# Patient Record
Sex: Male | Born: 1985
Health system: Southern US, Community
[De-identification: ages and names within clinical notes are randomized; demographics above are authoritative.]

## PROBLEM LIST (undated history)

## (undated) DIAGNOSIS — G4700 Insomnia, unspecified: Secondary | ICD-10-CM

## (undated) DIAGNOSIS — J45909 Unspecified asthma, uncomplicated: Secondary | ICD-10-CM

## (undated) DIAGNOSIS — Z9889 Other specified postprocedural states: Secondary | ICD-10-CM

## (undated) DIAGNOSIS — D869 Sarcoidosis, unspecified: Secondary | ICD-10-CM

## (undated) DIAGNOSIS — I10 Essential (primary) hypertension: Secondary | ICD-10-CM

## (undated) DIAGNOSIS — G43009 Migraine without aura, not intractable, without status migrainosus: Secondary | ICD-10-CM

## (undated) DIAGNOSIS — M549 Dorsalgia, unspecified: Secondary | ICD-10-CM

## (undated) HISTORY — DX: Insomnia, unspecified: G47.00

## (undated) HISTORY — DX: Essential (primary) hypertension: I10

## (undated) HISTORY — DX: Migraine without aura, not intractable, without status migrainosus: G43.009

## (undated) HISTORY — DX: Sarcoidosis, unspecified: D86.9

## (undated) HISTORY — DX: Dorsalgia, unspecified: M54.9

## (undated) HISTORY — DX: Other specified postprocedural states: Z98.890

---
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
## 2006-03-12 ENCOUNTER — Emergency Department (HOSPITAL_COMMUNITY): Admission: EM | Admit: 2006-03-12 | Discharge: 2006-03-12 | Payer: Self-pay | Admitting: Emergency Medicine

## 2006-03-15 ENCOUNTER — Ambulatory Visit: Payer: Self-pay | Admitting: Family Medicine

## 2006-05-08 ENCOUNTER — Ambulatory Visit: Payer: Self-pay | Admitting: Family Medicine

## 2006-05-08 ENCOUNTER — Encounter: Admission: RE | Admit: 2006-05-08 | Discharge: 2006-05-08 | Payer: Self-pay | Admitting: Sports Medicine

## 2006-06-29 ENCOUNTER — Encounter (INDEPENDENT_AMBULATORY_CARE_PROVIDER_SITE_OTHER): Payer: Self-pay | Admitting: *Deleted

## 2006-07-25 ENCOUNTER — Telehealth: Payer: Self-pay | Admitting: *Deleted

## 2006-07-26 ENCOUNTER — Ambulatory Visit: Payer: Self-pay | Admitting: Family Medicine

## 2007-01-09 ENCOUNTER — Emergency Department (HOSPITAL_COMMUNITY): Admission: EM | Admit: 2007-01-09 | Discharge: 2007-01-10 | Payer: Self-pay | Admitting: Emergency Medicine

## 2007-01-12 ENCOUNTER — Telehealth (INDEPENDENT_AMBULATORY_CARE_PROVIDER_SITE_OTHER): Payer: Self-pay | Admitting: *Deleted

## 2007-03-12 ENCOUNTER — Ambulatory Visit: Payer: Self-pay | Admitting: Family Medicine

## 2007-05-04 ENCOUNTER — Encounter (INDEPENDENT_AMBULATORY_CARE_PROVIDER_SITE_OTHER): Payer: Self-pay | Admitting: Family Medicine

## 2007-05-04 ENCOUNTER — Ambulatory Visit: Payer: Self-pay | Admitting: Family Medicine

## 2007-05-04 DIAGNOSIS — F329 Major depressive disorder, single episode, unspecified: Secondary | ICD-10-CM

## 2007-05-04 LAB — CONVERTED CEMR LAB
Platelets: 245 10*3/uL (ref 150–400)
WBC: 5.8 10*3/uL (ref 4.0–10.5)

## 2007-05-07 ENCOUNTER — Encounter (INDEPENDENT_AMBULATORY_CARE_PROVIDER_SITE_OTHER): Payer: Self-pay | Admitting: Family Medicine

## 2007-05-11 ENCOUNTER — Ambulatory Visit: Payer: Self-pay | Admitting: Family Medicine

## 2007-05-28 ENCOUNTER — Ambulatory Visit: Payer: Self-pay | Admitting: Family Medicine

## 2007-06-03 ENCOUNTER — Encounter: Admission: RE | Admit: 2007-06-03 | Discharge: 2007-06-03 | Payer: Self-pay | Admitting: Family Medicine

## 2007-09-14 ENCOUNTER — Ambulatory Visit: Payer: Self-pay | Admitting: Family Medicine

## 2007-09-14 ENCOUNTER — Encounter: Payer: Self-pay | Admitting: Family Medicine

## 2007-09-14 DIAGNOSIS — G4700 Insomnia, unspecified: Secondary | ICD-10-CM

## 2007-09-14 HISTORY — DX: Insomnia, unspecified: G47.00

## 2007-09-14 LAB — CONVERTED CEMR LAB: GC Probe Amp, Urine: NEGATIVE

## 2007-09-17 ENCOUNTER — Encounter: Payer: Self-pay | Admitting: Family Medicine

## 2007-09-19 ENCOUNTER — Encounter: Payer: Self-pay | Admitting: Family Medicine

## 2007-12-03 ENCOUNTER — Telehealth: Payer: Self-pay | Admitting: *Deleted

## 2007-12-03 ENCOUNTER — Ambulatory Visit: Payer: Self-pay | Admitting: Family Medicine

## 2008-01-15 ENCOUNTER — Telehealth: Payer: Self-pay | Admitting: *Deleted

## 2008-01-16 ENCOUNTER — Ambulatory Visit: Payer: Self-pay | Admitting: Family Medicine

## 2008-01-30 ENCOUNTER — Ambulatory Visit: Payer: Self-pay | Admitting: Family Medicine

## 2008-01-30 ENCOUNTER — Encounter: Payer: Self-pay | Admitting: Family Medicine

## 2008-01-30 DIAGNOSIS — M549 Dorsalgia, unspecified: Secondary | ICD-10-CM | POA: Insufficient documentation

## 2008-01-30 HISTORY — DX: Dorsalgia, unspecified: M54.9

## 2008-03-03 ENCOUNTER — Telehealth: Payer: Self-pay | Admitting: Family Medicine

## 2008-03-04 ENCOUNTER — Ambulatory Visit: Payer: Self-pay | Admitting: Family Medicine

## 2008-04-30 ENCOUNTER — Ambulatory Visit: Payer: Self-pay | Admitting: Family Medicine

## 2008-04-30 ENCOUNTER — Encounter: Admission: RE | Admit: 2008-04-30 | Discharge: 2008-04-30 | Payer: Self-pay | Admitting: Family Medicine

## 2008-04-30 ENCOUNTER — Encounter: Payer: Self-pay | Admitting: Family Medicine

## 2008-05-02 LAB — CONVERTED CEMR LAB: GC Probe Amp, Urine: NEGATIVE

## 2008-05-06 ENCOUNTER — Ambulatory Visit: Payer: Self-pay | Admitting: Family Medicine

## 2008-05-06 ENCOUNTER — Encounter: Payer: Self-pay | Admitting: Family Medicine

## 2008-07-06 ENCOUNTER — Emergency Department (HOSPITAL_BASED_OUTPATIENT_CLINIC_OR_DEPARTMENT_OTHER): Admission: EM | Admit: 2008-07-06 | Discharge: 2008-07-06 | Payer: Self-pay | Admitting: Emergency Medicine

## 2009-01-17 HISTORY — PX: ORIF FINGER FRACTURE: SHX2122

## 2009-04-20 ENCOUNTER — Emergency Department (HOSPITAL_BASED_OUTPATIENT_CLINIC_OR_DEPARTMENT_OTHER): Admission: EM | Admit: 2009-04-20 | Discharge: 2009-04-20 | Payer: Self-pay | Admitting: Emergency Medicine

## 2009-04-21 ENCOUNTER — Ambulatory Visit: Payer: Self-pay | Admitting: Family Medicine

## 2009-04-21 ENCOUNTER — Telehealth: Payer: Self-pay | Admitting: Family Medicine

## 2009-04-21 DIAGNOSIS — G43009 Migraine without aura, not intractable, without status migrainosus: Secondary | ICD-10-CM

## 2009-04-21 HISTORY — DX: Migraine without aura, not intractable, without status migrainosus: G43.009

## 2009-05-11 ENCOUNTER — Ambulatory Visit: Payer: Self-pay | Admitting: Family Medicine

## 2009-05-25 ENCOUNTER — Encounter: Payer: Self-pay | Admitting: Family Medicine

## 2009-05-25 ENCOUNTER — Ambulatory Visit: Payer: Self-pay | Admitting: Family Medicine

## 2009-05-25 DIAGNOSIS — J069 Acute upper respiratory infection, unspecified: Secondary | ICD-10-CM | POA: Insufficient documentation

## 2009-07-06 ENCOUNTER — Ambulatory Visit: Payer: Self-pay | Admitting: Diagnostic Radiology

## 2009-07-06 ENCOUNTER — Emergency Department (HOSPITAL_BASED_OUTPATIENT_CLINIC_OR_DEPARTMENT_OTHER): Admission: EM | Admit: 2009-07-06 | Discharge: 2009-07-06 | Payer: Self-pay | Admitting: Emergency Medicine

## 2010-02-18 NOTE — Progress Notes (Signed)
Summary: triage   Phone Note Call from Patient Call back at [phone]   Caller: mom-Rucinda Summary of Call: Pt had abcess and had it drained and put on an antibotic, but said he needed to be seen today by primary care giver. Initial call taken by: Clydell Hakim,  April 21, 2009 11:08 AM  Follow-up for Phone Call        LM Follow-up by: Golden Circle RN,  April 21, 2009 11:25 AM  Additional Follow-up for Phone Call Additional follow up Details #1::        got unusual noises in backround but no one spoke. no beep to let me know it was an answering machine Additional Follow-up by: Golden Circle RN,  April 21, 2009 12:10 PM    Additional Follow-up for Phone Call Additional follow up Details #2::    walked in. went to [facility] ED last night for elbow pain, migraines, abcess on arm. they lanced it & gave anitbiotic. appt now with pcp to cover most pressing issue. will need a regular appt to handle all of his issues Follow-up by: Golden Circle RN,  April 21, 2009 2:27 PM

## 2010-02-18 NOTE — Assessment & Plan Note (Signed)
Summary: cough,fever,sinus,HA/[place]   Vital Signs:  Patient profile:   25 year old male Weight:      154.2 pounds Pulse rate:   90 / minute BP sitting:   126 / 83  (right arm)  Vitals Entered By: Arlyss Repress CMA, (May 25, 2009 2:44 PM) CC: cough x 3 days. see note. Is Patient Diabetic? No Pain Assessment Patient in pain? no        Primary Care Provider:  Romero Belling MD  CC:  cough x 3 days. see note.Marland Kitchen  History of Present Illness:   2 weeks ago began having runny nose, this progressed to nasal congstion and post nasal drip associated with dry non productive cough. Last pm pt had fever 102F associated with headache and body aches, given Tylenol which relieved symptoms. Concerned mostly about his stuffy nose  ROS- denies N/V, diarrhea, admits to decreased appetite Tried Sudafed without relief, no sick contacts  Habits & Providers  Alcohol-Tobacco-Diet     Tobacco Status: quit  Current Medications (verified): 1)  Amitriptyline Hcl 50 Mg Tabs (Amitriptyline Hcl) .Marland Kitchen.. 1 Tab By Mouth At Bedtime For Headache Prevention 2)  Topiramate 25 Mg Tabs (Topiramate) .... Take 1 Pill Every Night Before Bed. Increase To 2 Pills After 1 Week If Still Having Headaches. 3)  Sumatriptan Succinate 6 Mg/0.40ml Kit (Sumatriptan Succinate) .... Inject Once At Onset of Headache 4)  Tramadol Hcl 50 Mg Tabs (Tramadol Hcl) .... Take 1 Pill Every 8 Hours As Needed For Pain in Your Back. 5)  Afrin Nasal Spray 0.05 % Soln (Oxymetazoline Hcl) .... 2 Sprays Each Nose Twice A Day As Needed  Allergies (verified): No Known Drug Allergies  Social History: Smoking Status:  quit  Physical Exam  General:  Well-developed,well-nourished,in no acute distress; alert,appropriate and cooperative throughout examination Vital signs noted  Eyes:  Non icteric , pink conjunctiva Ears:  External ear exam shows no significant lesions or deformities.  Otoscopic examination reveals clear canals, tympanic membranes  are intact bilaterally without bulging, retraction, inflammation or discharge. Hearing is grossly normal bilaterally. Nose:  No rhinorrhea, boggy turbinates, Mouth:  MMM, no exudatae Neck:  Shotty LAD left cervical chain Lungs:  CTAB Heart:  RRR   Impression & Recommendations:  Problem # 1:  VIRAL URI (ICD-465.9) Assessment New  Supportive care, as nasal congestion most worrisome, given Afrin with intructions on use. No evidence of sinusitis and history not compatible, no antibiotics at this time.  Orders: [facility]- Est Level  3 ([address])  Complete Medication List: 1)  Amitriptyline Hcl 50 Mg Tabs (Amitriptyline hcl) .Marland Kitchen.. 1 tab by mouth at bedtime for headache prevention 2)  Topiramate 25 Mg Tabs (Topiramate) .... Take 1 pill every night before bed. increase to 2 pills after 1 week if still having headaches. 3)  Sumatriptan Succinate 6 Mg/0.21ml Kit (Sumatriptan succinate) .... Inject once at onset of headache 4)  Tramadol Hcl 50 Mg Tabs (Tramadol hcl) .... Take 1 pill every 8 hours as needed for pain in your back. 5)  Afrin Nasal Spray 0.05 % Soln (Oxymetazoline hcl) .... 2 sprays each nose twice a day as needed  Patient Instructions: 1)  You have a virus, you do not need antibiotics for this  2)   Take Tylenol as needed for fever  and muscle aches, you can also try Ibuprofen 3)  Use the nasal spray as directed no longer than 3 days 4)  If your fever persist past Wed, or you have new symptoms, you  need to come back to be re-evaluated  Prescriptions: AFRIN NASAL SPRAY 0.05 % SOLN (OXYMETAZOLINE HCL) 2 sprays each nose twice a day as needed  #1 x 1   Entered and Authorized by:   Milinda Antis MD   Signed by:   Milinda Antis MD on 05/25/2009   Method used:   Electronically to        [facility] # [phone]* (retail)       [address]       [place], [place]  [address]       Ph: [phone]       Fax: [phone]   RxID:   [id number]
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
## 2010-02-18 NOTE — Miscellaneous (Signed)
Summary: waLK IN   Clinical Lists Changes states he has had symptoms for weeks. has tried otc w/o relief. had a high fever yesterday that has now broken.  placed in schedule to be seen now.Golden Circle RN  May 25, 2009 2:41 PM

## 2010-02-18 NOTE — Assessment & Plan Note (Signed)
Summary: abscess, headache, back pain   Vital Signs:  Patient profile:   25 year old male Height:      67.75 inches Weight:      164 pounds BMI:     25.21 BSA:     1.88 Temp:     98.3 degrees F Pulse rate:   94 / minute BP sitting:   144 / 75  Vitals Entered By: Jone Baseman CMA (April 21, 2009 2:43 PM) CC: abscess, headeache, back pain Is Patient Diabetic? No Pain Assessment Patient in pain? yes     Location: left arm Intensity: 7   Primary Care Provider:  Romero Belling MD  CC:  abscess, headeache, and back pain.  History of Present Illness: 25 yo male seen as workin with 3 complaints:  ABSCESS, RIGHT ARM.  Drained last night at ED--I&D, started on Bactrim.  Had been there for 2 days.  No fevers or pain.  No drainage today.  Taking Bactrim as prescribed.  HEADACHES.  All day, every day, moderate to severe LEFT side from orbit to occiput.  Taking Vicodin (prescribed for back pain, ran out) and Exedrin without relief. Duration = 2 months.  No focal weakness or numbness.  Some nausea, no vomiting, mild photophobia.  BACKACHE.  Chronic.  Deferred today.  Habits & Providers  Alcohol-Tobacco-Diet     Tobacco Status: current     Tobacco Counseling: to quit use of tobacco products     Cigarette Packs/Day: 0.5  Allergies (verified): No Known Drug Allergies  Social History: Smoking Status:  current Packs/Day:  0.5  Review of Systems       Per HPI.  Physical Exam  Additional Exam:  VITALS:  Reviewed, hypertensive GEN: Alert & oriented, no acute distress NEURO:  CN II-XII intact, 5/5 strength bilateral UE HEAD:  Normocephalic, atraumatic. EYES:  No corneal or conjunctival inflammation noted. EOMI. PERRLA.  Vision grossly normal. EARS:  External ear without significant lesions or deformities.  Clear canals, TM intact bilaterally without bulging, retraction, inflammation or discharge. Hearing grossly normal bilaterally. SKIN:  RIGHT ARM--no fluctuance, no  spreading erythema, nontender   Impression & Recommendations:  Problem # 1:  COMMON MIGRAINE (ICD-346.10) Assessment New  New onset x2 months.  Daily.  See patient instructions. Have started Amitriptyline 25 mg by mouth at bedtime x4 days, then 50 mg by mouth at bedtime.  Orders: [facility]- Est  Level 4 ([address])  Problem # 2:  CELLULITIS AND ABSCESS OF UPPER ARM AND FOREARM (ICD-682.3) Assessment: Improved  RIGHT ARM--resolved after I&D last night.  Finish antibiotics.  His updated medication list for this problem includes:    Bactrim Ds 800-160 Mg Tabs (Sulfamethoxazole-trimethoprim) .Marland Kitchen... 1 tab by mouth two times a day x3 days  Orders: [facility]- Est  Level 4 ([address])  Complete Medication List: 1)  Bactrim Ds 800-160 Mg Tabs (Sulfamethoxazole-trimethoprim) .Marland Kitchen.. 1 tab by mouth two times a day x3 days 2)  Amitriptyline Hcl 50 Mg Tabs (Amitriptyline hcl) .Marland Kitchen.. 1 tab by mouth at bedtime for headache prevention  Patient Instructions: 1)  Good to see you today. 2)  For Migraine:  Take Amitriptyline 1/2 tablet at bedtime for 4 nights, then 1 full tablet at bedtime.  Stop taking all other pain medicines, stop smoking, no caffeine.  Keep a headache calendar--mark the days you have a headache. 3)  For abscess--should resolve fine, just finish the antibiotics. 4)  One of the most important things you can do for your health is to STOP  SMOKING.  When you are ready to quit smoking, please call the "quit line" at 1-800-QUIT-NOW ((838)455-5257).  You can get more information about the quit line at [email or web address].  It is free! 5)  Please schedule a follow-up appointment in 2 weeks for headache.  Prescriptions: AMITRIPTYLINE HCL 50 MG TABS (AMITRIPTYLINE HCL) 1 tab by mouth at bedtime for headache prevention  #30 x 3   Entered and Authorized by:   Romero Belling MD   Signed by:   Romero Belling MD on 04/21/2009   Method used:   Print then Give to Patient   RxID:   [id number]   Appended Document:  abscess, headache, back pain Correction:  headaches are on the RIGHT, cellulitis is on the LEFT arm.

## 2010-03-04 ENCOUNTER — Encounter: Payer: Self-pay | Admitting: *Deleted

## 2010-04-07 LAB — WOUND CULTURE

## 2010-04-20 ENCOUNTER — Other Ambulatory Visit: Payer: Self-pay | Admitting: Family Medicine

## 2010-04-20 MED ORDER — TRAMADOL HCL 50 MG PO TABS: 50.0000 mg | ORAL_TABLET | Freq: Three times a day (TID) | ORAL | Status: DC | PRN

## 2010-04-30 ENCOUNTER — Other Ambulatory Visit: Payer: Self-pay | Admitting: Family Medicine

## 2010-04-30 MED ORDER — TRAMADOL HCL 50 MG PO TABS: 50.0000 mg | ORAL_TABLET | Freq: Three times a day (TID) | ORAL | Status: DC | PRN

## 2010-05-18 ENCOUNTER — Ambulatory Visit (INDEPENDENT_AMBULATORY_CARE_PROVIDER_SITE_OTHER): Payer: BC Managed Care – PPO | Admitting: Family Medicine

## 2010-05-18 VITALS — BP 125/90 | HR 104 | Temp 98.0°F | Wt 155.0 lb

## 2010-05-18 DIAGNOSIS — R05 Cough: Secondary | ICD-10-CM | POA: Insufficient documentation

## 2010-05-18 MED ORDER — AZITHROMYCIN 250 MG PO TABS: ORAL_TABLET | ORAL | Status: AC

## 2010-05-18 MED ORDER — HYDROCOD POLST-CHLORPHEN POLST 10-8 MG/5ML PO LQCR: 5.0000 mL | Freq: Every evening | ORAL | Status: DC | PRN

## 2010-05-18 NOTE — Progress Notes (Signed)
  Subjective:    Patient ID: Jeremiah Mason, male    DOB: 1985-10-17, 25 y.o.   MRN: [id number]  Cough This is a new problem. The current episode started 1 to 4 weeks ago (about 8 days ago). The problem has been gradually worsening. The problem occurs constantly (worse at night). The cough is productive of sputum. Associated symptoms include chest pain, chills, a fever, myalgias and wheezing. Pertinent negatives include no ear congestion, ear pain, headaches, heartburn, hemoptysis, nasal congestion, postnasal drip, rash, rhinorrhea, sore throat, shortness of breath, sweats or weight loss. The symptoms are aggravated by lying down. He has tried a beta-agonist inhaler, OTC cough suppressant and rest for the symptoms. The treatment provided no relief. His past medical history is significant for asthma and pneumonia. Hx of atypical pneumonia about 1 year ago.  This feels the same as that      Review of Systems  Constitutional: Positive for fever and chills. Negative for weight loss.  HENT: Negative for ear pain, sore throat, rhinorrhea and postnasal drip.   Respiratory: Positive for cough and wheezing. Negative for hemoptysis and shortness of breath.   Cardiovascular: Positive for chest pain.  Gastrointestinal: Negative for heartburn.  Musculoskeletal: Positive for myalgias.  Skin: Negative for rash.  Neurological: Negative for headaches.       Objective:   Physical Exam  Vitals reviewed. Constitutional: He appears well-nourished. No distress.  HENT:  Nose: Nose normal.  Mouth/Throat: Oropharynx is clear and moist. No oropharyngeal exudate.  Eyes: Conjunctivae are normal. Right eye exhibits no discharge. Left eye exhibits no discharge.  Neck: Normal range of motion. Neck supple.  Cardiovascular: Normal rate, regular rhythm and normal heart sounds.   No murmur heard. Pulmonary/Chest: Effort normal and breath sounds normal. No stridor. No respiratory distress. He has no wheezes. He has no  rales. He exhibits no tenderness.       No consolidation.  Faint rhales in right lower lung base   Abdominal: Soft.  Musculoskeletal: He exhibits no edema.  Lymphadenopathy:    He has no cervical adenopathy.  Skin: Skin is warm and dry. No rash noted. He is not diaphoretic.          Assessment & Plan:

## 2010-05-18 NOTE — Assessment & Plan Note (Signed)
Concerning for atypical pneumonia.  His cough is getting worse.  He has had atypical pneumonia about 1 year ago and this feels the same as that.  He required an abx and rx cough suppressant to get over it.  Advised him that this could be a pneumonia so will treat with Azithromycin and Tussionex.  Precautions given.  RTC in 5 days if not better.

## 2010-05-18 NOTE — Patient Instructions (Signed)
Pneumonia Pneumonia is an infection of the lungs. It may be caused by a bacteria or virus. Most forms are bacterial. Usually, these infections are caused by breathing infectious particles into the lungs (respiratory tract). SYMPTOMS   The most common problems (symptoms) are:    Cough.   Fever.    Chest pain.   Increased rate of breathing.     Wheezing.   Mucus production.     DIAGNOSIS   Often these infections are diagnosed on exam by your caregiver. Sometimes the diagnosis may require:    Chest X-rays.   Blood analysis.   Cultures. Blood cultures may be done to help find the cause of your pneumonia.  Your caregiver may do tests (blood gasses or pulse oximetry) to see how well your lungs are working. TREATMENT The bacterial pneumonias generally respond well to medicines (antibiotics) that kill germs. Viral infections must run their course. These infections will not respond to antibiotics. A pneumococcal shot (vaccine) is available to prevent a common bacterial pneumonia. This is usually suggested for the elderly and for other groups of higher risk individuals, such as those on chemotherapy or those who have problems with their immune system.  You will have pneumococcal screening or vaccination if you are over 48 years old and are not immunized.   If you are a smoker, it is time to quit. You may receive instructions on how to best stop smoking. Your caregiver can provide medicines and counseling to help you quit.  HOME CARE INSTRUCTIONS  Cough suppressants may be used if you are losing too much rest. However, coughing protects you by clearing your lungs. This is one reason for not using cough suppressants, if able, as they take away this protection.   Your caregiver may have prescribed an antibiotic if she or he feels your cough is caused by a bacterial infection. Take all your medicine until you are finished.   Your caregiver may also prescribe an expectorant to loosen the mucus to  be coughed up.   Only take over-the-counter or prescription medicines for pain, discomfort, or fever as directed by your caregiver.   Smoking is a common cause of bronchitis and can contribute to pneumonia. Stopping this habit is an important self-help step.   If you are a smoker and continue to smoke, your cough may last several weeks after your pneumonia has cleared.   A cold steam vaporizer or humidifier in your room or home may help loosen mucus.   Coughing is often worse at night. Sleeping in a semi-upright position in a recliner or using a couple pillows under your head will help with this.   Get rest as you feel it is needed. Your body will usually let you know when to rest.  SEEK IMMEDIATE MEDICAL CARE IF:  You develop pus-like mucus (sputum) or your illness becomes worse. This is especially true if you are elderly or weakened from any other disease.   You cannot control your cough with suppressants and are losing sleep.   You begin coughing up blood.   You develop pain which is getting worse or is uncontrolled with medicines.   You or your child has an oral temperature above 101, not controlled by medicine.   Any of the symptoms which initially brought you in for treatment are getting worse rather than better.   You develop shortness of breath or chest pain.  MAKE SURE YOU:    Understand these instructions.   Will watch your condition.  Will get help right away if you are not doing well or get worse.  Document Released: 01/03/2005 Document Re-Released: 06/23/2009 [facility] Patient Information 2011 [place], [place].

## 2010-06-02 ENCOUNTER — Other Ambulatory Visit: Payer: Self-pay | Admitting: Family Medicine

## 2010-06-02 MED ORDER — SUMATRIPTAN SUCCINATE 6 MG/0.5ML ~~LOC~~ SOLN: 6.0000 mg | Freq: Once | SUBCUTANEOUS | Status: DC

## 2010-06-04 NOTE — Op Note (Signed)
NAME:  Jeremiah Mason, Jeremiah Mason                        ACCOUNT NO.:  [id number]   MEDICAL RECORD NO.:  [id number]                   PATIENT TYPE:  AMB   LOCATION:  [facility]                                  FACILITY:  [facility]   PHYSICIAN:  Lowell Bouton, M.D.      DATE OF BIRTH:  [birth year]   DATE OF PROCEDURE:  06/20/2003  DATE OF DISCHARGE:                                 OPERATIVE REPORT   PREOPERATIVE DIAGNOSIS:  Oblique fracture, middle phalanx, right small  finger, that was intra-articular at the distal interphalangeal joint.   POSTOPERATIVE DIAGNOSIS:  Oblique fracture, middle phalanx, right small  finger, that was intra-articular at the distal interphalangeal joint.   PROCEDURE:  Closed reduction and percutaneous pinning of middle phalanx  fracture, right small finger.   SURGEON:  Lowell Bouton, M.D.   ANESTHESIA:  General.   OPERATIVE FINDINGS:  The patient had an oblique intra-articular fracture of  the middle phalanx that was not displaced.   PROCEDURE:  Under general anesthesia, the right hand was prepped and draped  in the usual fashion and under x-ray control, a 0.035 K-wire was passed  transversely across the middle phalanx.  A second 0.035 K-wire was placed  just proximal to the first from radial to ulnar across the fracture site.  X-  rays showed good position of the pins and good position of the fracture.  Clinically there was no gross rotational deformity.  The pins were bent over  and left protruding from the skin and sterile dressings were applied.  Marcaine 0.5% digital block was inserted for pain control.  The patient was  splinted and went to the recovery room awake and stable, in good condition.                                               Lowell Bouton, M.D.    EMM/MEDQ  D:  06/20/2003  T:  06/21/2003  Job:  [id number]

## 2010-08-24 ENCOUNTER — Ambulatory Visit (INDEPENDENT_AMBULATORY_CARE_PROVIDER_SITE_OTHER): Payer: BC Managed Care – PPO | Admitting: Family Medicine

## 2010-08-24 ENCOUNTER — Encounter: Payer: Self-pay | Admitting: Family Medicine

## 2010-08-24 DIAGNOSIS — F410 Panic disorder [episodic paroxysmal anxiety] without agoraphobia: Secondary | ICD-10-CM

## 2010-08-24 DIAGNOSIS — M549 Dorsalgia, unspecified: Secondary | ICD-10-CM

## 2010-08-24 DIAGNOSIS — Z111 Encounter for screening for respiratory tuberculosis: Secondary | ICD-10-CM

## 2010-08-24 DIAGNOSIS — M25569 Pain in unspecified knee: Secondary | ICD-10-CM | POA: Insufficient documentation

## 2010-08-24 DIAGNOSIS — F329 Major depressive disorder, single episode, unspecified: Secondary | ICD-10-CM

## 2010-08-24 DIAGNOSIS — Z20828 Contact with and (suspected) exposure to other viral communicable diseases: Secondary | ICD-10-CM

## 2010-08-24 MED ORDER — VENLAFAXINE HCL ER 37.5 MG PO CP24: ORAL_CAPSULE | ORAL | Status: DC

## 2010-08-24 MED ORDER — TRAMADOL HCL 50 MG PO TABS: 50.0000 mg | ORAL_TABLET | Freq: Three times a day (TID) | ORAL | Status: DC | PRN

## 2010-08-24 NOTE — Patient Instructions (Addendum)
For your knee, I want you to go see Dr. Prince Rome again.  Call his office and make an appt  For the panic attacks, I want you to try venlafaxine.  You will start with one tab and then increase to 2 tabs.  This may also help your pain.    I will refill your tramadol.    Rarely, tramadol and an antidepressant can cause a bad reaction.  I have included what to look for.    I will call you if there is anything positive on the STD screenSerotonin Syndrome Taking some kinds of drugs increases the serotonin in your body. Serotonin is a brain chemical that regulates the nervous system. Drugs that increase the serotonin in your body include:    Anti-depressant medications.   St. John's wort.   Recreational drugs.     Migraine medicines.   Some pain medicines.     Combining these drugs increases the risk you will become ill with a toxic condition called serotonin syndrome.   Symptoms of too much serotonin include:  Confusion.   Agitation.    Weakness.   Insomnia.    Fever.   Sweats.    Other symptoms that may develop include:  Shakiness.   Muscle spasms.   Seizures.  Hospital treatment is often needed until the effects are controlled. Avoiding the combination of medicines listed above is recommended. Check with your doctor if you are concerned about your medicine or the side effects. Document Released: 02/11/2004 Document Re-Released: 10/13/2007 [facility] Patient Information 2011 [place], [place].

## 2010-08-24 NOTE — Assessment & Plan Note (Signed)
No red flags.  Will start effexor today.  RTC in one month for recheck

## 2010-08-24 NOTE — Assessment & Plan Note (Signed)
Left knee, no acute changes, no swelling.  Will have Dr. Prince Rome evaluate this at next appt.  Continue brace and ice PRN

## 2010-08-24 NOTE — Assessment & Plan Note (Signed)
May play into pain, but not clinically depressed today.  Will start effexor for panic and chronic pain
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
## 2010-08-24 NOTE — Assessment & Plan Note (Signed)
Asked pt to make appt with Dr. Prince Rome for reevaluation, continue injections if warranted.

## 2010-08-25 LAB — GC/CHLAMYDIA PROBE AMP, URINE: GC Probe Amp, Urine: NEGATIVE

## 2010-11-18 ENCOUNTER — Telehealth: Payer: Self-pay | Admitting: Family Medicine

## 2010-11-18 NOTE — Telephone Encounter (Signed)
Phone call to the emergency line tonight:  Bright red blood in stool off and on x 1 year,  Called emergency line tonight b/c had increase amount of BRBPR on tissue paper and in toilet.   No dizziness, no syncope,  No history of GI Bleed. No abd pain. Told pt to call office at 8:30am to get an appointment for further evaluation.

## 2010-11-19 ENCOUNTER — Ambulatory Visit: Payer: BC Managed Care – PPO | Admitting: Family Medicine

## 2010-11-25 ENCOUNTER — Ambulatory Visit (INDEPENDENT_AMBULATORY_CARE_PROVIDER_SITE_OTHER): Payer: BC Managed Care – PPO | Admitting: Family Medicine

## 2010-11-25 DIAGNOSIS — S61209A Unspecified open wound of unspecified finger without damage to nail, initial encounter: Secondary | ICD-10-CM

## 2010-11-25 DIAGNOSIS — S61219A Laceration without foreign body of unspecified finger without damage to nail, initial encounter: Secondary | ICD-10-CM | POA: Insufficient documentation

## 2010-11-25 DIAGNOSIS — K5900 Constipation, unspecified: Secondary | ICD-10-CM | POA: Insufficient documentation

## 2010-11-25 DIAGNOSIS — R300 Dysuria: Secondary | ICD-10-CM | POA: Insufficient documentation

## 2010-11-25 LAB — POCT URINALYSIS DIPSTICK
Glucose, UA: NEGATIVE
Ketones, UA: NEGATIVE
Nitrite, UA: NEGATIVE
Protein, UA: NEGATIVE
pH, UA: 7.5

## 2010-11-25 MED ORDER — DOCUSATE SODIUM 100 MG PO CAPS: 100.0000 mg | ORAL_CAPSULE | Freq: Two times a day (BID) | ORAL | Status: AC

## 2010-11-25 NOTE — Assessment & Plan Note (Signed)
Likely cause of bright red blood per rectum. No red flags. Physical exam benign. Patient is on tramadol without bowel regimen. Will start him on Colace twice a day to help decrease the amount he needs to strain when having a bowel movement.

## 2010-11-25 NOTE — Patient Instructions (Addendum)
It was nice to meet you today. You can talk with her regular doctor about changing the dose of your tramadol. I think that the blood in your stool is likely from your constipation. I am starting you on a medicine called Colace. This will help to soften your stools and make it less difficult for you to have a bowel movement. Your urine does not look like it is infected.  Please come back if he starts having fevers, chills, pain when urinating, or new pain in your back. Please keep your appointment with your regular doctor later this month. Please return if you have an increased amount of bright red blood per rectum, change in your stools, start having fevers or chills, or start having significant abdominal pain.

## 2010-11-25 NOTE — Progress Notes (Signed)
S:  1. Pt comes in today for rectal bleeding. Patient states that 2-3 days ago he had a significant amount of bright red blood both in the toilet bowl and on the toilet tissue. He denies any melena. He does endorse constipation. He states that he has a bowel movement every 2-3 days. He often needs to strain. He thinks that this is related to the fact that he is on tramadol. He is not on any bowel regimen. He's never had rectal bleeding like this before. He denies any nausea, vomiting, diarrhea. He has not state that he has had any change in his bowel habits. He states that his stools look similar to previously. They are not small-caliber. He has not had any abdominal pain.  2. Patient also reports that he "feels like something is stuck" in his penis when he is urinating. He also endorses increased frequency. He states that it does not burn. He is currently sexually active, however he denies any penile discharge. He states he is one partner and is not concerned for STDs at this time. He denies any fever chills, or flank pain.  3. patient also concerned with cut on left index finger that he sustained with scissors yesterday. His were cleaned out of a car. There was minimal to moderate bleeding. He has minimal pain. He has full range of motion and no swelling.  ROS: Per HPI  History  Smoking status  . Current Everyday Smoker -- 0.3 packs/day  . Types: Cigars  Smokeless tobacco  . Never Used    O:  Filed Vitals:   11/25/10 0843  BP: 126/98  Pulse:   Temp:     Gen: NAD, appropriate CV: RRR, no murmur Pulm: CTA bilat, no wheezes or crackles Abd: soft, NT, + BS x 4 Ext: Warm, no chronic skin changes, no edema; left index finger with small 1.5 cm superficial laceration exposing adipose tissue, no surrounding erythema or edema, no need for stitching, no active bleeding Rectal: no obvious external or internal hemorrhages, no fissures, no active bleed, normal rectal tone, no masses palpable     A/P: 25 y.o. male p/w blood in stool -See problem list -f/u PRN

## 2010-11-25 NOTE — Assessment & Plan Note (Signed)
Symptoms are very vague. Patient declined STD testing as he states he has the same partner from August when he was last tested. Urinalysis negative today for infection. We'll continue to monitor. Red flags for return discussed with patient.

## 2010-11-25 NOTE — Assessment & Plan Note (Signed)
Very superficial, hemodynamically stable, no need for repair. Full range of motion and sensation throughout finger. Should heal nicely on its own.

## 2010-12-15 ENCOUNTER — Encounter: Payer: BC Managed Care – PPO | Admitting: Family Medicine

## 2011-01-19 ENCOUNTER — Other Ambulatory Visit: Payer: Self-pay | Admitting: Family Medicine

## 2011-01-19 MED ORDER — TRAMADOL HCL 50 MG PO TABS: 50.0000 mg | ORAL_TABLET | Freq: Three times a day (TID) | ORAL | Status: DC | PRN

## 2011-01-19 NOTE — Telephone Encounter (Signed)
Jeremiah Mason is wanting a refill on Tramadol sent to [facility] in [facility].

## 2011-01-19 NOTE — Telephone Encounter (Signed)
Left message informing rx has been sent in. 

## 2011-01-19 NOTE — Telephone Encounter (Signed)
Sent in

## 2011-01-25 ENCOUNTER — Encounter: Payer: BC Managed Care – PPO | Admitting: Family Medicine

## 2011-04-25 ENCOUNTER — Ambulatory Visit
Admission: RE | Admit: 2011-04-25 | Discharge: 2011-04-25 | Disposition: A | Discharge status: Home / Self Care | Payer: BC Managed Care – PPO | Source: Ambulatory Visit | Attending: Family Medicine | Admitting: Family Medicine

## 2011-04-25 ENCOUNTER — Ambulatory Visit (INDEPENDENT_AMBULATORY_CARE_PROVIDER_SITE_OTHER): Payer: BC Managed Care – PPO | Admitting: Family Medicine

## 2011-04-25 ENCOUNTER — Encounter: Payer: Self-pay | Admitting: Family Medicine

## 2011-04-25 VITALS — BP 129/96 | HR 75 | Temp 97.2°F | Ht 67.75 in | Wt 160.8 lb

## 2011-04-25 DIAGNOSIS — G5603 Carpal tunnel syndrome, bilateral upper limbs: Secondary | ICD-10-CM

## 2011-04-25 DIAGNOSIS — M25529 Pain in unspecified elbow: Secondary | ICD-10-CM

## 2011-04-25 DIAGNOSIS — M25521 Pain in right elbow: Secondary | ICD-10-CM

## 2011-04-25 DIAGNOSIS — G5600 Carpal tunnel syndrome, unspecified upper limb: Secondary | ICD-10-CM

## 2011-04-25 DIAGNOSIS — Z23 Encounter for immunization: Secondary | ICD-10-CM

## 2011-04-25 MED ORDER — ALBUTEROL SULFATE HFA 108 (90 BASE) MCG/ACT IN AERS: 2.0000 | INHALATION_SPRAY | Freq: Four times a day (QID) | RESPIRATORY_TRACT | Status: DC | PRN

## 2011-04-25 MED ORDER — PREDNISONE 20 MG PO TABS: 20.0000 mg | ORAL_TABLET | Freq: Every day | ORAL | Status: AC

## 2011-04-25 NOTE — Patient Instructions (Signed)
You have carpal tunnel on both sides, exacerbated by new job. Try splints on wrists, especially at night. Try prednisone daily for next 2 weeks. Make an appointment with sports medicine clinic if still having pain in 2 weeks. Will check xray and call you with results.  Carpal Tunnel Syndrome The carpal tunnel is a narrow hollow area in the wrist. It is formed by the wrist bones and ligaments. Nerves, blood vessels, and tendons (cord like structures which attach muscle to bone) on the palm side (the side of your hand in the direction your fingers bend) of your hand pass through the carpal tunnel. Repeated wrist motion or certain diseases may cause swelling within the tunnel. (That is why these are called repetitive trauma (damage caused by over use) disorders. It is also a common problem in late pregnancy.) This swelling pinches the main nerve in the wrist (median nerve) and causes the painful condition called carpal tunnel syndrome. A feeling of "pins and needles" may be noticed in the fingers or hand; however, the entire arm may ache from this condition. Carpal tunnel syndrome may clear up by itself. Cortisone injections may help. Sometimes, an operation may be needed to free the pinched nerve. An electromyogram (a type of test) may be needed to confirm this diagnosis (learning what is wrong). This is a test which measures nerve conduction. The nerve conduction is usually slowed in a carpal tunnel syndrome. HOME CARE INSTRUCTIONS   If your caregiver prescribed medication to help reduce swelling, take as directed.   If you were given a splint to keep your wrist from bending, use it as instructed. It is important to wear the splint at night. Use the splint for as long as you have pain or numbness in your hand, arm or wrist. This may take 1 to 2 months.   If you have pain at night, it may help to rub or shake your hand, or elevate your hand above the level of your heart (the center of your chest).   It  is important to give your wrist a rest by stopping the activities that are causing the problem. If your symptoms (problems) are work-related, you may need to talk to your employer about changing to a job that does not require using your wrist.   Only take over-the-counter or prescription medicines for pain, discomfort, or fever as directed by your caregiver.   Following periods of extended use, particularly strenuous use, apply an ice pack wrapped in a towel to the anterior (palm) side of the affected wrist for 20 to 30 minutes. Repeat as needed three to four times per day. This will help reduce the swelling.   Follow all instructions for follow-up with your caregiver. This includes any orthopedic referrals, physical therapy, and rehabilitation. Any delay in obtaining necessary care could result in a delay or failure of your condition to heal.  SEEK IMMEDIATE MEDICAL CARE IF:   You are still having pain and numbness following a week of treatment.   You develop new, unexplained symptoms.   Your current symptoms are getting worse and are not helped or controlled with medications.  MAKE SURE YOU:   Understand these instructions.   Will watch your condition.   Will get help right away if you are not doing well or get worse.  Document Released: 01/01/2000 Document Revised: 12/23/2010 Document Reviewed: 11/19/2010 [facility] Patient Information 2012 [place], [place].

## 2011-04-26 DIAGNOSIS — M25521 Pain in right elbow: Secondary | ICD-10-CM | POA: Insufficient documentation

## 2011-04-26 NOTE — Assessment & Plan Note (Signed)
Possibly mild bursitis but with trauma/fall will obtain plain films to evaluate for bone integrity.

## 2011-04-26 NOTE — Assessment & Plan Note (Signed)
Exacerbated by new physical demanding job. Mild improvement already with wrist braces. Recommend continuation of conservative management with bracing at night and during his job if possible. Will add course of low-dose prednisone 20 mg daily x14 days. Patient to followup in 2 weeks with sports medicine clinic if not improved. He may benefit from a carpal tunnel steroid injections given the rapid onset and severity of symptoms.

## 2011-04-26 NOTE — Progress Notes (Signed)
  Subjective:    Patient ID: Jeremiah Mason, male    DOB: 1985/05/10, 26 y.o.   MRN: [id number]  HPI  1. Hand numbness/tingling. For past 2 weeks, and have been worsening bilateral hand numbness and tingling. Radiates to fingers with exception of fifth digit. Some radiation to wrist and distal forearm also. Has a mild component of weakness when he is reaching and lifting boxes. 2 days ago purchased bilateral wrist braces and has noticed a mild improvement thus far. 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 Denies arm weakness, edema, erythema. Does have a mild rash on right forearm that itches at times.   2. Left elbow pain. Patient states he fell sideways onto a wood pallet and landed on his left elbow, having pain there with elbow flexion since the incident. No weakness, bony defects, swelling noted.   Review of Systems See HPI otherwise negative. Smokes Psychologist, clinical.     Objective:   Physical Exam  Vitals reviewed. Constitutional: He is oriented to person, place, and time. He appears well-developed and well-nourished. No distress.  HENT:  Head: Normocephalic and atraumatic.  Eyes: EOM are normal.  Neck: Normal range of motion.       nontender.  Cardiovascular: Normal rate, regular rhythm and normal heart sounds.   No murmur heard. Pulmonary/Chest: Effort normal.  Musculoskeletal: Normal range of motion. He exhibits tenderness. He exhibits no edema.       Right elbow has mild TTP at olecranon, posterior to bursa. No edema or redness.   Positive phalen, negative tinnel sign. Symmetric grip strength, interosseous strength. Sensation intact to monofilament and light touch. Normal musculature. No edema or erythema.  Neurological: He is alert and oriented to person, place, and time. Coordination normal.  Skin: He is not diaphoretic.  Psychiatric:  He has a normal mood and affect.       Assessment & Plan:

## 2011-05-02 ENCOUNTER — Telehealth: Payer: Self-pay | Admitting: *Deleted

## 2011-05-02 NOTE — Telephone Encounter (Signed)
Attempted to call patient to inform of below. Number listed for home has exceeded its maximum capacity for messages, mobile number is not in service

## 2011-05-02 NOTE — Telephone Encounter (Signed)
Message copied by Farrell Ours on Mon May 02, 2011 12:04 PM ------      Message from: Darci Needle      Created: Mon May 02, 2011 11:42 AM      Regarding: FW: xray                    ----- Message -----         From: Durwin Reges, MD         Sent: 04/26/2011   3:55 PM           To: [facility]      Subject: xray                                                     Please call patient to inform his elbow xray is normal. No fractures. Continue ice and may use motrin.

## 2011-05-03 NOTE — Telephone Encounter (Signed)
attempted one more time same messages

## 2011-05-03 NOTE — Telephone Encounter (Signed)
Once again attempted to call patient and got message the same from yesterday

## 2011-05-04 NOTE — Telephone Encounter (Signed)
Attempted one more time. Closing the chart due to multiple phone calls

## 2011-06-06 ENCOUNTER — Other Ambulatory Visit: Payer: Self-pay | Admitting: Family Medicine

## 2011-08-30 ENCOUNTER — Other Ambulatory Visit: Payer: Self-pay | Admitting: Family Medicine

## 2011-09-12 ENCOUNTER — Other Ambulatory Visit: Payer: Self-pay | Admitting: Family Medicine

## 2011-09-12 NOTE — Telephone Encounter (Signed)
Needs appointment for more refills.

## 2011-10-14 ENCOUNTER — Other Ambulatory Visit: Payer: Self-pay | Admitting: Family Medicine

## 2011-10-14 ENCOUNTER — Telehealth: Payer: Self-pay | Admitting: Family Medicine

## 2011-10-14 NOTE — Telephone Encounter (Signed)
Unfortunately, Jeremiah Mason noted in his chart on 09/12/11 that Jeremiah Mason would need an appointment for more refills.  I do not know this patient so must honor Jeremiah Mason's judgement. Please notify him.

## 2011-10-14 NOTE — Telephone Encounter (Signed)
Patient needs Tramadol refilled by 1:00pm today because he is going out of town.  Dr. Madolyn Frieze out of office.  Will route request to Dr. Lula Olszewski and call patient back.  Gaylene Brooks, RN

## 2011-10-14 NOTE — Telephone Encounter (Signed)
Pt has a refill request in system and he is going out of town today - wants to know if he can get this refilled today before lunch.

## 2011-10-14 NOTE — Telephone Encounter (Signed)
Patient informed he needs office visit before Tramadol can be refilled.  Scheduled appt with Dr. Madolyn Frieze for 11/01/11 at 8:45am.  Gaylene Brooks, RN

## 2011-11-01 ENCOUNTER — Other Ambulatory Visit (HOSPITAL_COMMUNITY)
Admission: RE | Admit: 2011-11-01 | Discharge: 2011-11-01 | Disposition: A | Discharge status: Home / Self Care | Payer: BC Managed Care – PPO | Source: Ambulatory Visit | Attending: Family Medicine | Admitting: Family Medicine

## 2011-11-01 ENCOUNTER — Ambulatory Visit (INDEPENDENT_AMBULATORY_CARE_PROVIDER_SITE_OTHER): Payer: BC Managed Care – PPO | Admitting: Family Medicine

## 2011-11-01 ENCOUNTER — Encounter: Payer: Self-pay | Admitting: Family Medicine

## 2011-11-01 VITALS — BP 125/75 | HR 101 | Temp 98.3°F | Ht 67.75 in | Wt 158.0 lb

## 2011-11-01 DIAGNOSIS — M549 Dorsalgia, unspecified: Secondary | ICD-10-CM

## 2011-11-01 DIAGNOSIS — Z0000 Encounter for general adult medical examination without abnormal findings: Secondary | ICD-10-CM

## 2011-11-01 DIAGNOSIS — Z113 Encounter for screening for infections with a predominantly sexual mode of transmission: Secondary | ICD-10-CM | POA: Insufficient documentation

## 2011-11-01 DIAGNOSIS — R21 Rash and other nonspecific skin eruption: Secondary | ICD-10-CM

## 2011-11-01 DIAGNOSIS — Z23 Encounter for immunization: Secondary | ICD-10-CM

## 2011-11-01 DIAGNOSIS — Z2089 Contact with and (suspected) exposure to other communicable diseases: Secondary | ICD-10-CM

## 2011-11-01 DIAGNOSIS — Z202 Contact with and (suspected) exposure to infections with a predominantly sexual mode of transmission: Secondary | ICD-10-CM

## 2011-11-01 LAB — RPR

## 2011-11-01 LAB — HIV ANTIBODY (ROUTINE TESTING W REFLEX): HIV: NONREACTIVE

## 2011-11-01 NOTE — Patient Instructions (Addendum)
Follow-up in 3 months for more Tramadol refills  We will call you with your lab results  Try over-the-counter hydrocortisone cream for the itchiness on your arm  It was nice to meet you.

## 2011-11-02 DIAGNOSIS — Z0000 Encounter for general adult medical examination without abnormal findings: Secondary | ICD-10-CM | POA: Insufficient documentation

## 2011-11-02 DIAGNOSIS — R21 Rash and other nonspecific skin eruption: Secondary | ICD-10-CM | POA: Insufficient documentation

## 2011-11-02 NOTE — Assessment & Plan Note (Addendum)
Will check GC/Chlamydia, HIV, hepatitis B surface Ag, RPR and call with results.  Flu shot today.

## 2011-11-02 NOTE — Assessment & Plan Note (Signed)
Itchiness without obvious rash for 2 weeks. May be reaction to metal bracelet. Try OTC hydrocortisone.
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
## 2011-11-02 NOTE — Assessment & Plan Note (Signed)
Pain is controlled on Tramadol. He seems to have chronic muscular pain. Continue Tramadol. He seems to have tried alternative therapies (PT, chiropractor), with no significant benefit. Discussed concern about tolerance to Tramadol over time and how if no longer works and needs narcotics, would probably need referral back to orthopedics. Patient expressed understanding.

## 2011-11-03 ENCOUNTER — Telehealth: Payer: Self-pay | Admitting: Family Medicine

## 2011-11-03 NOTE — Telephone Encounter (Signed)
LVM. Informed of normal STI testing results.

## 2011-11-16 ENCOUNTER — Telehealth: Payer: Self-pay | Admitting: Family Medicine

## 2011-11-16 MED ORDER — TRAMADOL HCL 50 MG PO TABS: 50.0000 mg | ORAL_TABLET | Freq: Three times a day (TID) | ORAL | Status: DC | PRN

## 2011-11-16 NOTE — Telephone Encounter (Signed)
Please apologize to patient for my not sending and notify that it now has been sent Thank you

## 2011-11-16 NOTE — Telephone Encounter (Signed)
Patient is calling because [facility] on [address] in [place], never received the refill on Tramadol.

## 2012-03-26 ENCOUNTER — Telehealth: Payer: Self-pay | Admitting: Family Medicine

## 2012-03-26 NOTE — Telephone Encounter (Signed)
Patient is calling because he doesn't have any more refills on his Tramadol and he has enough for 2 or 3 days.Marland Kitchen  He uses [facility] on [address] in [place].

## 2012-03-27 MED ORDER — TRAMADOL HCL 50 MG PO TABS: 50.0000 mg | ORAL_TABLET | Freq: Three times a day (TID) | ORAL | Status: DC | PRN

## 2012-03-27 NOTE — Telephone Encounter (Signed)
Please notify Rx sent. Thank you.

## 2014-01-17 DIAGNOSIS — Z9889 Other specified postprocedural states: Secondary | ICD-10-CM

## 2014-01-17 HISTORY — DX: Other specified postprocedural states: Z98.890

## 2014-01-17 HISTORY — PX: ANTERIOR CRUCIATE LIGAMENT REPAIR: SHX115

## 2014-03-12 ENCOUNTER — Encounter: Payer: Self-pay | Admitting: Family Medicine

## 2014-03-12 ENCOUNTER — Ambulatory Visit (INDEPENDENT_AMBULATORY_CARE_PROVIDER_SITE_OTHER): Payer: Self-pay | Admitting: Family Medicine

## 2014-03-12 VITALS — BP 153/102 | HR 82 | Temp 98.2°F | Wt 187.0 lb

## 2014-03-12 DIAGNOSIS — G43809 Other migraine, not intractable, without status migrainosus: Secondary | ICD-10-CM

## 2014-03-12 DIAGNOSIS — L03012 Cellulitis of left finger: Secondary | ICD-10-CM

## 2014-03-12 DIAGNOSIS — IMO0001 Reserved for inherently not codable concepts without codable children: Secondary | ICD-10-CM | POA: Insufficient documentation

## 2014-03-12 MED ORDER — SUMATRIPTAN SUCCINATE 6 MG/0.5ML ~~LOC~~ SOLN: 6.0000 mg | Freq: Once | SUBCUTANEOUS | Status: DC

## 2014-03-12 MED ORDER — SULFAMETHOXAZOLE-TRIMETHOPRIM 800-160 MG PO TABS: 1.0000 | ORAL_TABLET | Freq: Two times a day (BID) | ORAL | Status: DC
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
## 2014-03-12 NOTE — Assessment & Plan Note (Signed)
Paronychia by exam, no felon presnent. Afebrile. 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 - I&D'ed in clinic. Change dressings daily.  - Bactrim DS BID x 10 days. $4 without insurance at [facility]. - F/u 2 days for close evaluation due to high risk for felon. Xray if not improving. - Ibuprofen/tylenol PRN pain. - Precepted and examined with Dr Mingo Amber

## 2014-03-12 NOTE — Patient Instructions (Signed)
This is a paronychia (infection of the tip of the finger). It has not yet extended into the pulp of the finger. We cut into it to drain the pus. We also started bactrim, which you will take twice daily for 10 days. Follow up on Friday to see how this is doing. Seek immediate care with severe worsening, fevers, or chills. Follow up for migraines with your primary doctor. I can do one refill on your injectable medication.  Best,  Hilton Sinclair, MD  Paronychia  Paronychia is an infection of the skin caused by germs. It happens by the fingernail or toenail. You can avoid it by not:  Pulling on hangnails.  Nail biting.  Thumb sucking.  Cutting fingernails and toenails too short.  Cutting the skin at the base and sides of the fingernail or toenail (cuticle). HOME CARE  Keep the fingers or toes very dry. Put rubber gloves over cotton gloves when putting hands in water.  Keep the wound clean and bandaged (dressed) as told by your doctor.  Soak the fingers or toes in warm water for 15 to 20 minutes. Soak them 3 to 4 times per day for germ infections. Fungal infections are difficult to treat. Fungal infections often require treatment for a long time.  Only take medicine as told by your doctor. GET HELP RIGHT AWAY IF:   You have redness, puffiness (swelling), or pain that gets worse.  You see yellowish-white fluid (pus) coming from the wound.  You have a fever.  You have a bad smell coming from the wound or bandage. MAKE SURE YOU:  Understand these instructions.  Will watch your condition.  Will get help if you are not doing well or get worse. Document Released: 12/22/2008 Document Revised: 03/28/2011 Document Reviewed: 12/22/2008 [facility] Patient Information 2015 [place], [place]. This information is not intended to replace advice given to you by your health care provider. Make sure you discuss any questions you have with your health care provider.

## 2014-03-14 ENCOUNTER — Encounter: Payer: Self-pay | Admitting: Family Medicine

## 2014-03-14 ENCOUNTER — Ambulatory Visit (INDEPENDENT_AMBULATORY_CARE_PROVIDER_SITE_OTHER): Payer: Self-pay | Admitting: Family Medicine

## 2014-03-14 VITALS — BP 149/99 | HR 70 | Temp 98.0°F | Wt 191.0 lb

## 2014-03-14 DIAGNOSIS — IMO0001 Reserved for inherently not codable concepts without codable children: Secondary | ICD-10-CM

## 2014-03-14 DIAGNOSIS — L03012 Cellulitis of left finger: Secondary | ICD-10-CM

## 2014-03-14 DIAGNOSIS — R030 Elevated blood-pressure reading, without diagnosis of hypertension: Secondary | ICD-10-CM

## 2014-03-14 DIAGNOSIS — I10 Essential (primary) hypertension: Secondary | ICD-10-CM | POA: Insufficient documentation

## 2014-03-14 NOTE — Patient Instructions (Signed)
It was nice seeing you today, I am glad your finger swelling is getting better, please continue A/B as instructed, return in about 1 wk to see your PCP for reassment. You also need follow up for your elevated BP, you might need to be on medication if persistent.  Paronychia  Paronychia is an infection of the skin caused by germs. It happens by the fingernail or toenail. You can avoid it by not:  Pulling on hangnails.  Nail biting.  Thumb sucking.  Cutting fingernails and toenails too short.  Cutting the skin at the base and sides of the fingernail or toenail (cuticle). HOME CARE  Keep the fingers or toes very dry. Put rubber gloves over cotton gloves when putting hands in water.  Keep the wound clean and bandaged (dressed) as told by your doctor.  Soak the fingers or toes in warm water for 15 to 20 minutes. Soak them 3 to 4 times per day for germ infections. Fungal infections are difficult to treat. Fungal infections often require treatment for a long time.  Only take medicine as told by your doctor. GET HELP RIGHT AWAY IF:   You have redness, puffiness (swelling), or pain that gets worse.  You see yellowish-white fluid (pus) coming from the wound.  You have a fever.  You have a bad smell coming from the wound or bandage. MAKE SURE YOU:  Understand these instructions.  Will watch your condition.  Will get help if you are not doing well or get worse. Document Released: 12/22/2008 Document Revised: 03/28/2011 Document Reviewed: 12/22/2008 [facility] Patient Information 2015 [place], [place]. This information is not intended to replace advice given to you by your health care provider. Make sure you discuss any questions you have with your health care provider.

## 2014-03-14 NOTE — Progress Notes (Signed)
Subjective:     Patient ID: Jeremiah Mason, male   DOB: 09-12-1985, 29 y.o.   MRN: [id number]  HPI  Paronychia: Here to follow up for his left ring finger infection for which he had I&D 2 days ago, he was started on Bactrim then and has been compliant with his medication.Patient stated his hand swelling and pain had improved tremendously since he had I&D as well as started his A/B. 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 Elevated BP: Denies hx of HTN, not on medication, denies any concern. His mom has hx of HTN.  Current Outpatient Prescriptions on File Prior to Visit  Medication Sig Dispense Refill  . sulfamethoxazole-trimethoprim (BACTRIM DS,SEPTRA DS) 800-160 MG per tablet Take 1 tablet by mouth 2 (two) times daily. 20 tablet 0  . albuterol (PROVENTIL HFA;VENTOLIN HFA) 108 (90 BASE) MCG/ACT inhaler Inhale 2 puffs into the lungs every 6 (six) hours as needed for wheezing. 1 Inhaler 1  . cetirizine (ZYRTEC) 10 MG tablet Take 10 mg by mouth daily.      . SUMAtriptan (IMITREX) 6 MG/0.5ML SOLN injection Inject 0.5 mLs (6 mg total) into the skin once. At onset of headache 0.5 mL 1  . topiramate (TOPAMAX) 25 MG tablet take 1 pill every night before bed. Increase to 2 pills after 1 week if still having headaches.     . traMADol (ULTRAM) 50 MG tablet Take 1 tablet (50 mg total) by mouth 3 (three) times daily as needed for pain. 270 tablet 0  . venlafaxine (EFFEXOR XR) 37.5 MG 24 hr capsule Take one daily for 1 week then increase to 2 tabs. 60 capsule 1   No current facility-administered medications on file prior to visit.   History reviewed. No pertinent past medical history.    Review of Systems  Respiratory: Negative.   Cardiovascular: Negative.   Musculoskeletal:       Left fourth finger pain and swelling  All other systems reviewed and are negative.      Filed Vitals:   03/14/14 0953  BP: 149/99  Pulse: 70  Temp: 98 F (36.7 C)    TempSrc: Oral  Weight: 191 lb (86.637 kg)    Objective:   Physical Exam  Constitutional: He appears well-developed. No distress.  Cardiovascular: Normal rate, regular rhythm and normal heart sounds.   No murmur heard. Pulmonary/Chest: Effort normal and breath sounds normal. No respiratory distress. He has no wheezes.  Musculoskeletal:       Hands: Nursing note and vitals reviewed.      Assessment:     Paronychia: Elevated BP:     Plan:     Check problem list.

## 2014-03-14 NOTE — Assessment & Plan Note (Signed)
No prior hx of HTN. I reviewed his vitals flow sheet, he had elevated BP only twice in the last 6 months. I recommended f/u in 1 wk with PCP for reassessment. Might need to be on medication if this persist.

## 2014-03-14 NOTE — Assessment & Plan Note (Signed)
Improves from last visit. He has been on Bactrim only for 2 days. I recommended continuation of medication for the next 5-7 days. F/U in 1 wk for reassessment or sooner if worsening. I recommended good hand hygiene and protection especially at work. Came in sooner if symptoms worsens. He agreed with plan.

## 2014-03-16 ENCOUNTER — Emergency Department (INDEPENDENT_AMBULATORY_CARE_PROVIDER_SITE_OTHER)
Admission: EM | Admit: 2014-03-16 | Discharge: 2014-03-16 | Disposition: A | Discharge status: Home / Self Care | Payer: Self-pay | Source: Home / Self Care | Attending: Emergency Medicine | Admitting: Emergency Medicine

## 2014-03-16 ENCOUNTER — Encounter (HOSPITAL_COMMUNITY): Payer: Self-pay | Admitting: *Deleted

## 2014-03-16 ENCOUNTER — Emergency Department (INDEPENDENT_AMBULATORY_CARE_PROVIDER_SITE_OTHER): Payer: Self-pay

## 2014-03-16 DIAGNOSIS — S8991XA Unspecified injury of right lower leg, initial encounter: Secondary | ICD-10-CM

## 2014-03-16 MED ORDER — HYDROCODONE-ACETAMINOPHEN 5-325 MG PO TABS
1.0000 | ORAL_TABLET | Freq: Once | ORAL | Status: AC
  Administered 2014-03-16: 1 via ORAL

## 2014-03-16 MED ORDER — ASPIRIN EC 325 MG PO TBEC: 325.0000 mg | DELAYED_RELEASE_TABLET | Freq: Every day | ORAL | Status: DC

## 2014-03-16 MED ORDER — HYDROCODONE-ACETAMINOPHEN 5-325 MG PO TABS
ORAL_TABLET | ORAL | Status: AC
  Filled 2014-03-16: qty 1

## 2014-03-16 MED ORDER — HYDROCODONE-ACETAMINOPHEN 5-325 MG PO TABS: 1.0000 | ORAL_TABLET | Freq: Once | ORAL | Status: DC

## 2014-03-16 NOTE — ED Notes (Signed)
Playing basketball at 0100 at [employer].  He made a sudden stop and the other player fell on his R leg.  States it went out laterally and heard a pop.  Appears swollen.  Has tried ice and elevation. Can't straighten it or bear much wt.

## 2014-03-16 NOTE — ED Notes (Signed)
X ray done

## 2014-03-16 NOTE — ED Provider Notes (Signed)
CSN: [id number]     Arrival date & time 03/16/14  1558 History   First MD Initiated Contact with Patient 03/16/14 1709     Chief Complaint  Patient presents with  . Knee Pain   (Consider location/radiation/quality/duration/timing/severity/associated sxs/prior Treatment) HPI  He is a 29 year old man here for evaluation of right knee pain. He states he was playing basketball last night when he stopped suddenly and the other player fell across the medial part of his knee. He states it "went out" and popped. He had immediate pain and swelling in the knee. He has been able to bear weight on it, but with significant pain. He has been taking Excedrin with some improvement. He is unable to fully extend his leg. It is also painful to bend the knee. He also reports severe pain, and a sensation of the leg going out when he rotates on the leg.  History reviewed. No pertinent past medical history. Past Surgical History  Procedure Laterality Date  . Orif finger fracture  2011    broken in 3 places-4 pins put in   History reviewed. No pertinent family history. History  Substance Use Topics  . Smoking status: Current Some Day Smoker -- 0.30 packs/day    Types: Cigars  . Smokeless tobacco: Never Used  . Alcohol Use: No    Review of Systems As in history of present illness Allergies  Review of patient's allergies indicates no known allergies.  Home Medications   Prior to Admission medications   Medication Sig Start Date End Date Taking? Authorizing Provider  Aspirin-Acetaminophen-Caffeine (EXCEDRIN MIGRAINE PO) Take by mouth.   Yes Historical Provider, MD  sulfamethoxazole-trimethoprim (BACTRIM DS,SEPTRA DS) 800-160 MG per tablet Take 1 tablet by mouth 2 (two) times daily. 03/12/14  Yes Hilton Sinclair, MD  albuterol (PROVENTIL HFA;VENTOLIN HFA) 108 (90 BASE) MCG/ACT inhaler Inhale 2 puffs into the lungs every 6 (six) hours as needed for wheezing. 04/25/11 04/24/12  Clovis Cao, MD  aspirin EC  325 MG tablet Take 1 tablet (325 mg total) by mouth daily. 03/16/14   Melony Overly, MD  cetirizine (ZYRTEC) 10 MG tablet Take 10 mg by mouth daily.      Historical Provider, MD  HYDROcodone-acetaminophen (NORCO/VICODIN) 5-325 MG per tablet Take 1 tablet by mouth once. 03/16/14   Melony Overly, MD  SUMAtriptan (IMITREX) 6 MG/0.5ML SOLN injection Inject 0.5 mLs (6 mg total) into the skin once. At onset of headache 03/12/14 03/12/14  Hilton Sinclair, MD  topiramate (TOPAMAX) 25 MG tablet take 1 pill every night before bed. Increase to 2 pills after 1 week if still having headaches.     Historical Provider, MD  traMADol (ULTRAM) 50 MG tablet Take 1 tablet (50 mg total) by mouth 3 (three) times daily as needed for pain. 03/27/12   Carolin Guernsey, MD  venlafaxine (EFFEXOR XR) 37.5 MG 24 hr capsule Take one daily for 1 week then increase to 2 tabs. 08/24/10 08/23/12  Judithann Sheen, MD   BP 132/93 mmHg  Pulse 85  Temp(Src) 98.7 F (37.1 C) (Oral)  Resp 16  SpO2 100% Physical Exam  Constitutional: He is oriented to person, place, and time. He appears well-developed and well-nourished. He appears distressed (looks uncomfortable with exam.).  Neck: Neck supple.  Cardiovascular: Normal rate.   Pulmonary/Chest: Effort normal.  Musculoskeletal:  Right knee: There is moderate swelling. Patella is in a comparable position to the left. He is tender over the patella as  well as the medial and lateral joint lines. MCL and LCL are intact. He does have slight give with anterior drawer testing compared to the left. 2+ DP pulse.  Neurological: He is alert and oriented to person, place, and time.    ED Course  Procedures (including critical care time) Labs Review Labs Reviewed - No data to display  Imaging Review Dg Knee Complete 4 Views Right  03/16/2014   CLINICAL DATA:  Medial knee impaction playing sports last night. Knee pain.  EXAM: RIGHT KNEE - COMPLETE 4+ VIEW  COMPARISON:  05/08/2006  FINDINGS: No  acute bony findings. Moderate to large knee effusion. 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  IMPRESSION: 1. Moderate to large knee effusion. 2. 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   Electronically Signed   By: Van Clines M.D.   On: 03/16/2014 18:17     MDM   1. Right knee injury, initial encounter    X-ray negative for acute fracture. Concern is for internal derangement. I called and discussed with Dr. Louanne Skye of orthopedic surgery.  We'll place an knee immobilizer with crutches. He will elevate the leg, ice to decrease swelling. Aspirin 325 mg twice a day. Norco prescription provided to use as needed for pain. He is to call [facility] tomorrow morning for an appointment in the next few days.    Melony Overly, MD 03/16/14 [phone]

## 2014-03-16 NOTE — Discharge Instructions (Signed)
Please call for an appointment at [facility]. Wear the knee immobilizer during the day. Use the crutches during the day. Apply ice to the knee as often as you can. Elevate the leg as much as possible. Take aspirin 325 mg twice a day. Use the Norco every 4 hours as needed for severe pain.

## 2014-03-21 ENCOUNTER — Ambulatory Visit: Payer: BC Managed Care – PPO | Admitting: Family Medicine

## 2014-03-28 ENCOUNTER — Ambulatory Visit: Payer: BC Managed Care – PPO | Admitting: Family Medicine

## 2014-04-22 ENCOUNTER — Ambulatory Visit: Payer: Self-pay | Admitting: Family Medicine

## 2014-04-22 ENCOUNTER — Ambulatory Visit (INDEPENDENT_AMBULATORY_CARE_PROVIDER_SITE_OTHER): Payer: Self-pay | Admitting: Obstetrics and Gynecology

## 2014-04-22 ENCOUNTER — Encounter: Payer: Self-pay | Admitting: Obstetrics and Gynecology

## 2014-04-22 ENCOUNTER — Telehealth: Payer: Self-pay | Admitting: Family Medicine

## 2014-04-22 ENCOUNTER — Ambulatory Visit (HOSPITAL_COMMUNITY)
Admission: RE | Admit: 2014-04-22 | Discharge: 2014-04-22 | Disposition: A | Discharge status: Home / Self Care | Payer: Self-pay | Source: Ambulatory Visit | Attending: Family Medicine | Admitting: Family Medicine

## 2014-04-22 VITALS — BP 136/88 | HR 85 | Temp 98.2°F | Wt 185.9 lb

## 2014-04-22 DIAGNOSIS — R509 Fever, unspecified: Secondary | ICD-10-CM | POA: Insufficient documentation

## 2014-04-22 DIAGNOSIS — J069 Acute upper respiratory infection, unspecified: Secondary | ICD-10-CM

## 2014-04-22 DIAGNOSIS — R0602 Shortness of breath: Secondary | ICD-10-CM | POA: Insufficient documentation

## 2014-04-22 DIAGNOSIS — R05 Cough: Secondary | ICD-10-CM | POA: Insufficient documentation

## 2014-04-22 NOTE — Progress Notes (Addendum)
Subjective:     Patient ID: Jeremiah Mason, male   DOB: Apr 29, 1985, 29 y.o.   MRN: [id number]  URI  This is a new problem. The current episode started in the past 7 days. The problem has been gradually worsening. The maximum temperature recorded prior to his arrival was 103 - 104 F (Fever broke with motrin.). The fever has been present for less than 1 day. Associated symptoms include coughing, headaches, rhinorrhea, sneezing and a sore throat. Pertinent negatives include no abdominal pain, dysuria, nausea, rash or vomiting. Associated symptoms comments: Body aches. Cough productive of yellow mucous.. He has tried decongestant and NSAIDs for the symptoms. The treatment provided no relief.   Of note, patient states that he usually gets "pneumonia" every year. He says his mother came into same day clinic yesterday and was diagnosed with pneumonia and he believes he also has pneumonia. Patient had received his flu shot for this past season.   Review of Systems  HENT: Positive for rhinorrhea, sneezing and sore throat.   Respiratory: Positive for cough.   Gastrointestinal: Negative for nausea, vomiting and abdominal pain.  Genitourinary: Negative for dysuria.  Skin: Negative for rash.  Neurological: Positive for headaches.      Objective:   Physical Exam  Constitutional: He appears well-developed and well-nourished.  HENT:  Nose: Rhinorrhea present.  Mouth/Throat: Oropharynx is clear and moist.  Neck: Neck supple.  Cardiovascular: Normal rate, regular rhythm and normal heart sounds.   Pulmonary/Chest: Effort normal and breath sounds normal. No respiratory distress. He has no wheezes. He has no rales.  Lymphadenopathy:    He has no cervical adenopathy.  Skin: Skin is warm and dry. No rash noted.      Assessment:     Jeremiah Mason is a 29 y.o. male who presents with URI symptoms concerning for possible viral illness (bronchiolitis) vs pneumonia. Patient with fevers but no identifiable  source of infection currently.      Plan:     URI symptoms with fever: - CXR ordered to help rule out pneumonia; if positive will treat with antibiotics for a CAP - Continue over the counter medications for cold symtpoms - patient given instructions to RTC if symptoms worsen or not improved  - conservative management -handout given     Luiz Blare, DO PGY-1, [place]

## 2014-04-22 NOTE — Patient Instructions (Signed)
Please go over to hospital for chest x-ray. If it comes back for a pneumonia I will treat you for that. However this could just be a viral infection (handout below). The only treatment for that is rest and conservative management. For now continue over the counter cold medications. Return to clinic if symptoms worsen or not improved in about 5 days.    Upper Respiratory Infection, Adult An upper respiratory infection (URI) is also sometimes known as the common cold. The upper respiratory tract includes the nose, sinuses, throat, trachea, and bronchi. Bronchi are the airways leading to the lungs. Most people improve within 1 week, but symptoms can last up to 2 weeks. A residual cough may last even longer.  CAUSES Many different viruses can infect the tissues lining the upper respiratory tract. The tissues become irritated and inflamed and often become very moist. Mucus production is also common. A cold is contagious. You can easily spread the virus to others by oral contact. This includes kissing, sharing a glass, coughing, or sneezing. Touching your mouth or nose and then touching a surface, which is then touched by another person, can also spread the virus. SYMPTOMS  Symptoms typically develop 1 to 3 days after you come in contact with a cold virus. Symptoms vary from person to person. They may include:  Runny nose.  Sneezing.  Nasal congestion.  Sinus irritation.  Sore throat.  Loss of voice (laryngitis).  Cough.  Fatigue.  Muscle aches.  Loss of appetite.  Headache.  Low-grade fever. DIAGNOSIS  You might diagnose your own cold based on familiar symptoms, since most people get a cold 2 to 3 times a year. Your caregiver can confirm this based on your exam. Most importantly, your caregiver can check that your symptoms are not due to another disease such as strep throat, sinusitis, pneumonia, asthma, or epiglottitis. Blood tests, throat tests, and X-rays are not necessary to diagnose  a common cold, but they may sometimes be helpful in excluding other more serious diseases. Your caregiver will decide if any further tests are required. RISKS AND COMPLICATIONS  You may be at risk for a more severe case of the common cold if you smoke cigarettes, have chronic heart disease (such as heart failure) or lung disease (such as asthma), or if you have a weakened immune system. The very young and very old are also at risk for more serious infections. Bacterial sinusitis, middle ear infections, and bacterial pneumonia can complicate the common cold. The common cold can worsen asthma and chronic obstructive pulmonary disease (COPD). Sometimes, these complications can require emergency medical care and may be life-threatening. PREVENTION  The best way to protect against getting a cold is to practice good hygiene. Avoid oral or hand contact with people with cold symptoms. Wash your hands often if contact occurs. There is no clear evidence that vitamin C, vitamin E, echinacea, or exercise reduces the chance of developing a cold. However, it is always recommended to get plenty of rest and practice good nutrition. TREATMENT  Treatment is directed at relieving symptoms. There is no cure. Antibiotics are not effective, because the infection is caused by a virus, not by bacteria. Treatment may include:  Increased fluid intake. Sports drinks offer valuable electrolytes, sugars, and fluids.  Breathing heated mist or steam (vaporizer or shower).  Eating chicken soup or other clear broths, and maintaining good nutrition.  Getting plenty of rest.  Using gargles or lozenges for comfort.  Controlling fevers with ibuprofen or acetaminophen as  directed by your caregiver.  Increasing usage of your inhaler if you have asthma. Zinc gel and zinc lozenges, taken in the first 24 hours of the common cold, can shorten the duration and lessen the severity of symptoms. Pain medicines may help with fever, muscle  aches, and throat pain. A variety of non-prescription medicines are available to treat congestion and runny nose. Your caregiver can make recommendations and may suggest nasal or lung inhalers for other symptoms.  HOME CARE INSTRUCTIONS   Only take over-the-counter or prescription medicines for pain, discomfort, or fever as directed by your caregiver.  Use a warm mist humidifier or inhale steam from a shower to increase air moisture. This may keep secretions moist and make it easier to breathe.  Drink enough water and fluids to keep your urine clear or pale yellow.  Rest as needed.  Return to work when your temperature has returned to normal or as your caregiver advises. You may need to stay home longer to avoid infecting others. You can also use a face mask and careful hand washing to prevent spread of the virus. SEEK MEDICAL CARE IF:   After the first few days, you feel you are getting worse rather than better.  You need your caregiver's advice about medicines to control symptoms.  You develop chills, worsening shortness of breath, or brown or red sputum. These may be signs of pneumonia.  You develop yellow or brown nasal discharge or pain in the face, especially when you bend forward. These may be signs of sinusitis.  You develop a fever, swollen neck glands, pain with swallowing, or white areas in the back of your throat. These may be signs of strep throat. SEEK IMMEDIATE MEDICAL CARE IF:   You have a fever.  You develop severe or persistent headache, ear pain, sinus pain, or chest pain.  You develop wheezing, a prolonged cough, cough up blood, or have a change in your usual mucus (if you have chronic lung disease).  You develop sore muscles or a stiff neck. Document Released: 06/29/2000 Document Revised: 03/28/2011 Document Reviewed: 04/10/2013 [facility] Patient Information 2015 [place], [place]. This information is not intended to replace advice given to you by your health care  provider. Make sure you discuss any questions you have with your health care provider.

## 2014-04-22 NOTE — Telephone Encounter (Signed)
Would like results of todays chest xray

## 2014-04-22 NOTE — Telephone Encounter (Signed)
Called patient back to report that CXR was normal with no signs of pneumonia.

## 2014-04-22 NOTE — Telephone Encounter (Signed)
Will forward to Dr. Gerarda Fraction who saw him today. Kamaile Zachow,CMA

## 2014-05-26 ENCOUNTER — Telehealth: Payer: Self-pay | Admitting: Family Medicine

## 2014-05-26 DIAGNOSIS — S83519A Sprain of anterior cruciate ligament of unspecified knee, initial encounter: Secondary | ICD-10-CM

## 2014-05-26 NOTE — Telephone Encounter (Signed)
Will forward to MD. Desmen Schoffstall,CMA  

## 2014-05-26 NOTE — Telephone Encounter (Addendum)
Mr. Korber is asking for a referral to the [facility] for a torn ACL.  Insurance want pay for it until he get a referral.  Surgery is scheduled for May 16th.  Can fax to [phone]

## 2014-05-27 NOTE — Telephone Encounter (Signed)
I sent in an external referral to [facility]. Let me know if this is enough, or if there is something else that I need to fill out.   CGM MD

## 2014-06-13 ENCOUNTER — Telehealth: Payer: Self-pay | Admitting: Family Medicine

## 2014-06-13 NOTE — Telephone Encounter (Signed)
Pt called and wanted to know what his referral number is. He said the [facility] ortho didn't receive a referral from [place]. jw

## 2014-06-13 NOTE — Telephone Encounter (Signed)
Will forward to referral coordinator to see if she ever heard from [facility]. Jazmin Hartsell,CMA

## 2014-06-17 ENCOUNTER — Telehealth: Payer: Self-pay | Admitting: Family Medicine

## 2014-06-17 NOTE — Telephone Encounter (Signed)
Pt has called [employer] and changed his  Provider

## 2014-06-17 NOTE — Telephone Encounter (Signed)
Referral has been placed on [facility] online. Referral confimation [id number]. Will call [place] with confirmation info.

## 2014-06-17 NOTE — Telephone Encounter (Signed)
Patient request Referral number ASAP; he is waiting at the [facility]. Please, follow up with Patient.

## 2014-06-17 NOTE — Telephone Encounter (Signed)
Called again to [facility] as I had not heard anything.  Spoke with [employer].  Pt is seeing Salvadore Farber, MD [phone] and the Dx codes are [phone]   and   [phone].  Langley Gauss is requesting that the request be back dated 5 days and is reachable tomorrow with questions @ [phone]. Rodricus Candelaria, [name]

## 2014-06-17 NOTE — Telephone Encounter (Signed)
Checked the [facility] website, pt's card has indeed been changed. Now, waiting on a return call from [place] to see what MD the pt will seen tomorrow. Once we get that info, referral can be placed online for pt's appt.

## 2014-06-17 NOTE — Telephone Encounter (Signed)
Will forward to [place] to make her aware. Jazmin Hartsell,CMA

## 2014-06-17 NOTE — Telephone Encounter (Signed)
Received call from [facility] Referral coordinator.  When she logs onto [facility] website, we are not on pts card so we can place the referral.  Spoke with pt and he will call right now to get this changed. Jeremiah Mason, [name]

## 2014-06-17 NOTE — Telephone Encounter (Addendum)
Referral reviewed.  It seems that we are showing that he does not have insurance, so this is where the confusion is coming in.  Pt does in fact have [employer] (had front desk scan copy in chart).  I have Nelson @ [facility] to return call. To find out the MD and Dx codes that they are billing under.  Pt was informed while in clinic he understands but is still concerned because his appt is supposed to be tomorrow.  Will also forward to referral coordinator as she will be in clinic tomorrow. Sinda Leedom, [name]

## 2014-07-10 ENCOUNTER — Encounter: Payer: Self-pay | Admitting: Family Medicine

## 2014-07-10 ENCOUNTER — Ambulatory Visit (INDEPENDENT_AMBULATORY_CARE_PROVIDER_SITE_OTHER): Payer: 59 | Admitting: Family Medicine

## 2014-07-10 VITALS — BP 138/97 | HR 92 | Temp 98.2°F | Ht 67.0 in | Wt 180.0 lb

## 2014-07-10 DIAGNOSIS — G43C0 Periodic headache syndromes in child or adult, not intractable: Secondary | ICD-10-CM | POA: Diagnosis not present

## 2014-07-10 DIAGNOSIS — Z9889 Other specified postprocedural states: Secondary | ICD-10-CM

## 2014-07-10 DIAGNOSIS — G43809 Other migraine, not intractable, without status migrainosus: Secondary | ICD-10-CM

## 2014-07-10 MED ORDER — SUMATRIPTAN SUCCINATE 6 MG/0.5ML ~~LOC~~ SOLN: 6.0000 mg | Freq: Once | SUBCUTANEOUS | Status: DC

## 2014-07-10 MED ORDER — ASPIRIN-ACETAMINOPHEN-CAFFEINE 250-250-65 MG PO TABS: 1.0000 | ORAL_TABLET | Freq: Four times a day (QID) | ORAL | Status: DC | PRN

## 2014-07-10 NOTE — Patient Instructions (Signed)
Thanks for coming in today.   We will try ibuprofen for your back pain.   I have sent in a physical therapy referral.   I have refilled your excedrin migraine.   I have also refilled your imitrex.   Please follow up with your orthopedic surgeon in order to get your staples out.   Thanks for letting us take care of you.   Sincerely,  Paula Compton, MD

## 2014-07-15 ENCOUNTER — Emergency Department (INDEPENDENT_AMBULATORY_CARE_PROVIDER_SITE_OTHER)
Admission: EM | Admit: 2014-07-15 | Discharge: 2014-07-15 | Disposition: A | Discharge status: Home / Self Care | Payer: 59 | Source: Home / Self Care | Attending: Family Medicine | Admitting: Family Medicine

## 2014-07-15 ENCOUNTER — Encounter (HOSPITAL_COMMUNITY): Payer: Self-pay | Admitting: Emergency Medicine

## 2014-07-15 DIAGNOSIS — Z4802 Encounter for removal of sutures: Secondary | ICD-10-CM | POA: Diagnosis not present

## 2014-07-15 DIAGNOSIS — Z9889 Other specified postprocedural states: Secondary | ICD-10-CM | POA: Insufficient documentation

## 2014-07-15 NOTE — ED Notes (Signed)
Here to have staples removed... Missed appt w/surgeon who did right knee acl repair Alert, no acute distress... voices no new concerns
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
## 2014-07-15 NOTE — Assessment & Plan Note (Signed)
Pt. Not wanting to go to ortho to get staples removed due to copay. I discussed with him that I would not remove staples due to the fact that I did not do his surgery, and would not want to meddle with the work of his orthopedist. No signs of infection at this time. Wounds appear to be healing well and c/d/i.   - Advised to call his insurance company regarding copay.  - Advised to get ortho to remove staples as I did not perform the surgery.

## 2014-07-15 NOTE — Discharge Instructions (Signed)
Thank you for coming in today. Use the knee immobilizer until you see your orthopedic Dr.  Riki Sheer Minimization You will have a scar anytime you have surgery and a cut is made in the skin or you have something removed from your skin (mole, skin cancer, cyst). Although scars are unavoidable following surgery, there are ways to minimize their appearance. It is important to follow all the instructions you receive from your caregiver about wound care. How your wound heals will influence the appearance of your scar. If you do not follow the wound care instructions as directed, complications such as infection may occur. Wound instructions include keeping the wound clean, moist, and not letting the wound form a scab. Some people form scars that are raised and lumpy (hypertrophic) or larger than the initial wound (keloidal). HOME CARE INSTRUCTIONS   Follow wound care instructions as directed.  Keep the wound clean by washing it with soap and water.  Keep the wound moist with provided antibiotic cream or petroleum jelly until completely healed. Moisten twice a day for about 2 weeks.  Get stitches (sutures) taken out at the scheduled time.  Avoid touching or manipulating your wound unless needed. Wash your hands thoroughly before and after touching your wound.  Follow all restrictions such as limits on exercise or work. This depends on where your scar is located.  Keep the scar protected from sunburn. Cover the scar with sunscreen/sunblock with SPF 30 or higher.  Gently massage the scar using a circular motion to help minimize the appearance of the scar. Do this only after the wound has closed and all the sutures have been removed.  For hypertrophic or keloidal scars, there are several ways to treat and minimize their appearance. Methods include compression therapy, intralesional corticosteroids, laser therapy, or surgery. These methods are performed by your caregiver. Remember that the scar may appear  lighter or darker than your normal skin color. This difference in color should even out with time. SEEK MEDICAL CARE IF:   You have a fever.  You develop signs of infection such as pain, redness, pus, and warmth.  You have questions or concerns. Document Released: 06/23/2009 Document Revised: 03/28/2011 Document Reviewed: 06/23/2009 [facility] Patient Information 2015 [place], [place]. This information is not intended to replace advice given to you by your health care provider. Make sure you discuss any questions you have with your health care provider.

## 2014-07-15 NOTE — ED Provider Notes (Signed)
Jeremiah Mason is a 29 y.o. male who presents to Urgent Care today for staple removal. Patient had anterior cruciate ligament reconstruction using hamstring autograft as well as right lateral meniscus repair. This was performed in early June by an orthopedic surgeon. As part of the [facility] network. He missed his follow-up appointment on the 22nd for staple removal. His next appointment is July 5.  He called his orthopedic doctor who said it was okay to remove staples at an urgent care. He feels well with no radiating pain significant knee swelling fevers or chills.   History reviewed. No pertinent past medical history. Past Surgical History  Procedure Laterality Date  . Orif finger fracture  2011    broken in 3 places-4 pins put in   History  Substance Use Topics  . Smoking status: Current Some Day Smoker -- 0.30 packs/day    Types: Cigars  . Smokeless tobacco: Never Used  . Alcohol Use: No   ROS as above Medications: No current facility-administered medications for this encounter.   Current Outpatient Prescriptions  Medication Sig Dispense Refill  . albuterol (PROVENTIL HFA;VENTOLIN HFA) 108 (90 BASE) MCG/ACT inhaler Inhale 2 puffs into the lungs every 6 (six) hours as needed for wheezing. 1 Inhaler 1  . aspirin EC 325 MG tablet Take 1 tablet (325 mg total) by mouth daily. 30 tablet 0  . aspirin-acetaminophen-caffeine (EXCEDRIN MIGRAINE) 700-174-94 MG per tablet Take 1 tablet by mouth every 6 (six) hours as needed for migraine. 120 tablet 5  . cetirizine (ZYRTEC) 10 MG tablet Take 10 mg by mouth daily.      Marland Kitchen HYDROcodone-acetaminophen (NORCO/VICODIN) 5-325 MG per tablet Take 1 tablet by mouth once. 20 tablet 0  . sulfamethoxazole-trimethoprim (BACTRIM DS,SEPTRA DS) 800-160 MG per tablet Take 1 tablet by mouth 2 (two) times daily. 20 tablet 0  . SUMAtriptan (IMITREX) 6 MG/0.5ML SOLN injection Inject 0.5 mLs (6 mg total) into the skin once. At onset of headache 0.5 mL 1  . topiramate  (TOPAMAX) 25 MG tablet take 1 pill every night before bed. Increase to 2 pills after 1 week if still having headaches.     . traMADol (ULTRAM) 50 MG tablet Take 1 tablet (50 mg total) by mouth 3 (three) times daily as needed for pain. 270 tablet 0  . venlafaxine (EFFEXOR XR) 37.5 MG 24 hr capsule Take one daily for 1 week then increase to 2 tabs. 60 capsule 1   No Known Allergies   Exam:  BP 126/89 mmHg  Pulse 78  Temp(Src) 97.6 F (36.4 C) (Oral)  Resp 14  SpO2 100% Gen: Well NAD HEENT: EOMI,  MMM Right knee skin. Well-appearing incisions with multiple staples. No wound erythema or tenderness or swelling. Exts: Brisk capillary refill, warm and well perfused.   Staples removed No results found for this or any previous visit (from the past 24 hour(s)). No results found.  Assessment and Plan: 29 y.o. male with staple removal. Doing well. Scar minimization reviewed. Follow-up with orthopedics.  Discussed warning signs or symptoms. Please see discharge instructions. Patient expresses understanding.     Gregor Hams, MD 07/15/14 [phone]

## 2014-07-15 NOTE — Assessment & Plan Note (Signed)
Chronic pain for which he has used tramadol. Would like to get away from this tramadol if possible. Needs further PT.   - Ibuprofen prn.  - PT referral placed.  - Heat / ICe - Tenz unit as needed.  - Follow up if little improvement.  - Home exercises discussed.

## 2015-12-13 ENCOUNTER — Encounter (HOSPITAL_COMMUNITY): Payer: Self-pay | Admitting: *Deleted

## 2015-12-13 ENCOUNTER — Ambulatory Visit (HOSPITAL_COMMUNITY)
Admission: EM | Admit: 2015-12-13 | Discharge: 2015-12-13 | Disposition: A | Discharge status: Home / Self Care | Payer: Self-pay | Attending: Family Medicine | Admitting: Family Medicine

## 2015-12-13 DIAGNOSIS — M25561 Pain in right knee: Secondary | ICD-10-CM

## 2015-12-13 MED ORDER — HYDROCODONE-ACETAMINOPHEN 5-325 MG PO TABS: 1.0000 | ORAL_TABLET | Freq: Four times a day (QID) | ORAL | 0 refills | Status: DC | PRN

## 2015-12-13 NOTE — ED Triage Notes (Signed)
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

## 2015-12-13 NOTE — ED Provider Notes (Signed)
[place]    CSN: [id number] Arrival date & time: 12/13/15  1528     History   Chief Complaint Chief Complaint  Patient presents with  . Knee Pain    HPI Jeremiah Mason is a 30 y.o. male.   The history is provided by the patient.  Knee Pain  Location:  Knee Time since incident:  1 day Injury: no   Knee location:  R knee Pain details:    Quality:  Throbbing   Radiates to:  Does not radiate   Severity:  Moderate   Onset quality:  Sudden   Progression:  Unchanged Chronicity:  Recurrent (h/o ACL surg last yr in high [place] by [facility]., bending and straightened right knee yest and felt sudden pain and had swelling to knee. continues with pain with ambulation.) Dislocation: no   Prior injury to area:  Yes Worsened by:  Bearing weight Associated symptoms: decreased ROM     History reviewed. No pertinent past medical history.  Patient Active Problem List   Diagnosis Date Noted  . S/P ACL repair 07/15/2014  . Elevated BP 03/14/2014  . Paronychia of fourth finger of left hand 03/12/2014  . Preventative health care 11/02/2011  . Rash and other nonspecific skin eruption 11/02/2011  . COMMON MIGRAINE 04/21/2009  . BACK PAIN 01/30/2008  . INSOMNIA 09/14/2007    Past Surgical History:  Procedure Laterality Date  . ORIF FINGER FRACTURE  2011   broken in 3 places-4 pins put in       Home Medications    Prior to Admission medications   Medication Sig Start Date End Date Taking? Authorizing Provider  albuterol (PROVENTIL HFA;VENTOLIN HFA) 108 (90 BASE) MCG/ACT inhaler Inhale 2 puffs into the lungs every 6 (six) hours as needed for wheezing. 04/25/11 04/24/12  Clovis Cao, MD  aspirin EC 325 MG tablet Take 1 tablet (325 mg total) by mouth daily. 03/16/14   Melony Overly, MD  aspirin-acetaminophen-caffeine (EXCEDRIN MIGRAINE) [phone] MG per tablet Take 1 tablet by mouth every 6 (six) hours as needed for migraine. 07/10/14   Aquilla Hacker, MD    cetirizine (ZYRTEC) 10 MG tablet Take 10 mg by mouth daily.      Historical Provider, MD  HYDROcodone-acetaminophen (NORCO/VICODIN) 5-325 MG tablet Take 1 tablet by mouth every 6 (six) hours as needed. 12/13/15   Billy Fischer, MD  sulfamethoxazole-trimethoprim (BACTRIM DS,SEPTRA DS) 800-160 MG per tablet Take 1 tablet by mouth 2 (two) times daily. 03/12/14   Hilton Sinclair, MD  SUMAtriptan (IMITREX) 6 MG/0.5ML SOLN injection Inject 0.5 mLs (6 mg total) into the skin once. At onset of headache 07/10/14 07/10/14  Aquilla Hacker, MD  topiramate (TOPAMAX) 25 MG tablet take 1 pill every night before bed. Increase to 2 pills after 1 week if still having headaches.     Historical Provider, MD  traMADol (ULTRAM) 50 MG tablet Take 1 tablet (50 mg total) by mouth 3 (three) times daily as needed for pain. 03/27/12   Carolin Guernsey, MD  venlafaxine (EFFEXOR XR) 37.5 MG 24 hr capsule Take one daily for 1 week then increase to 2 tabs. 08/24/10 08/23/12  Judithann Sheen, MD    Family History History reviewed. No pertinent family history.  Social History Social History  Substance Use Topics  . Smoking status: Current Some Day Smoker    Packs/day: 0.30    Types: Cigars  . Smokeless tobacco: Never Used  . Alcohol  use No     Allergies   Patient has no known allergies.   Review of Systems Review of Systems  Constitutional: Negative.   Musculoskeletal: Positive for gait problem and joint swelling.  All other systems reviewed and are negative.    Physical Exam Triage Vital Signs ED Triage Vitals  Enc Vitals Group     BP 12/13/15 1623 132/76     Pulse Rate 12/13/15 1623 72     Resp 12/13/15 1623 16     Temp 12/13/15 1623 98.6 F (37 C)     Temp Source 12/13/15 1623 Oral     SpO2 12/13/15 1623 100 %     Weight --      Height --      Head Circumference --      Peak Flow --      Pain Score 12/13/15 1624 6     Pain Loc --      Pain Edu? --      Excl. in [place]? --    No data  found.   Updated Vital Signs BP 132/76 (BP Location: Right Arm)   Pulse 72   Temp 98.6 F (37 C) (Oral)   Resp 16   SpO2 100%   Visual Acuity Right Eye Distance:   Left Eye Distance:   Bilateral Distance:    Right Eye Near:   Left Eye Near:    Bilateral Near:     Physical Exam  Constitutional: He is oriented to person, place, and time. He appears well-developed and well-nourished. No distress.  Musculoskeletal: He exhibits tenderness. He exhibits no edema or deformity.       Right knee: He exhibits decreased range of motion. He exhibits no swelling, no effusion, no erythema, normal alignment, no LCL laxity, normal patellar mobility and no MCL laxity. No patellar tendon tenderness noted.  Neurological: He is alert and oriented to person, place, and time.  Skin: Skin is warm and dry.  Nursing note and vitals reviewed.    UC Treatments / Results  Labs (all labs ordered are listed, but only abnormal results are displayed) Labs Reviewed - No data to display  EKG  EKG Interpretation None       Radiology No results found.  Procedures Procedures (including critical care time)  Medications Ordered in UC Medications - No data to display   Initial Impression / Assessment and Plan / UC Course  I have reviewed the triage vital signs and the nursing notes.  Pertinent labs & imaging results that were available during my care of the patient were reviewed by me and considered in my medical decision making (see chart for details).  Clinical Course       Final Clinical Impressions(s) / UC Diagnoses   Final diagnoses:  Acute pain of right knee    New Prescriptions Discharge Medication List as of 12/13/2015  5:34 PM       Billy Fischer, MD 12/13/15 2041

## 2015-12-13 NOTE — Discharge Instructions (Signed)
Wear your knee brace and call your orthopedist on mon to discuss

## 2016-05-12 ENCOUNTER — Ambulatory Visit: Payer: Self-pay | Admitting: Internal Medicine

## 2016-08-05 ENCOUNTER — Encounter (HOSPITAL_COMMUNITY): Payer: Self-pay | Admitting: *Deleted

## 2016-08-05 ENCOUNTER — Ambulatory Visit (HOSPITAL_COMMUNITY)
Admission: EM | Admit: 2016-08-05 | Discharge: 2016-08-05 | Disposition: A | Discharge status: Nursing Home (Includes ICF) | Payer: Self-pay

## 2016-08-05 ENCOUNTER — Emergency Department (HOSPITAL_COMMUNITY): Payer: Self-pay

## 2016-08-05 ENCOUNTER — Emergency Department (HOSPITAL_COMMUNITY)
Admission: EM | Admit: 2016-08-05 | Discharge: 2016-08-06 | Disposition: A | Discharge status: Home / Self Care | Payer: Self-pay | Attending: Emergency Medicine | Admitting: Emergency Medicine

## 2016-08-05 DIAGNOSIS — Y929 Unspecified place or not applicable: Secondary | ICD-10-CM | POA: Insufficient documentation

## 2016-08-05 DIAGNOSIS — Y9367 Activity, basketball: Secondary | ICD-10-CM | POA: Insufficient documentation

## 2016-08-05 DIAGNOSIS — W01198A Fall on same level from slipping, tripping and stumbling with subsequent striking against other object, initial encounter: Secondary | ICD-10-CM | POA: Insufficient documentation

## 2016-08-05 DIAGNOSIS — Z7982 Long term (current) use of aspirin: Secondary | ICD-10-CM | POA: Insufficient documentation

## 2016-08-05 DIAGNOSIS — Z79899 Other long term (current) drug therapy: Secondary | ICD-10-CM | POA: Insufficient documentation

## 2016-08-05 DIAGNOSIS — I10 Essential (primary) hypertension: Secondary | ICD-10-CM | POA: Insufficient documentation

## 2016-08-05 DIAGNOSIS — Y998 Other external cause status: Secondary | ICD-10-CM | POA: Insufficient documentation

## 2016-08-05 DIAGNOSIS — F17290 Nicotine dependence, other tobacco product, uncomplicated: Secondary | ICD-10-CM | POA: Insufficient documentation

## 2016-08-05 DIAGNOSIS — S0990XA Unspecified injury of head, initial encounter: Secondary | ICD-10-CM | POA: Insufficient documentation

## 2016-08-05 MED ORDER — IBUPROFEN 800 MG PO TABS
800.0000 mg | ORAL_TABLET | Freq: Once | ORAL | Status: AC
  Administered 2016-08-06: 800 mg via ORAL
  Filled 2016-08-05: qty 1

## 2016-08-05 NOTE — ED Triage Notes (Signed)
Pt reports playing b-ball yesterday, hit left side of head with the rim of the basketball goal. No loc. Still has constant headache, mild dizziness. No relief with Excedrin. Denies n/v.

## 2016-08-05 NOTE — ED Triage Notes (Signed)
PT reports he was struck in the head yesterday by the metal rim of a basketball goal. PT reports blurry vision in left eye, throbbing headache, and three nosebleeds since injury. PT denies LOC. Cathlean Sauer, PA seeing PT in triage to assess.

## 2016-08-05 NOTE — ED Provider Notes (Signed)
[facility] DEPT Provider Note   CSN: [id number] Arrival date & time: 08/05/16  1719  By signing my name below, I, Mayer Masker, attest that this documentation has been prepared under the direction and in the presence of Avie Echevaria, PA-C. Electronically Signed: Mayer Masker, Scribe. 08/05/16. 7:55 PM.  History   Chief Complaint Chief Complaint  Patient presents with  . Head Injury   The history is provided by the patient. No language interpreter was used.    HPI Comments: Jeremiah Mason is a 31 y.o. male with h/o migraines who presents to the Emergency Department complaining of constant, gradually worsening left-sided HA that began yesterday. He states he was playing basketball when he dunked the ball, fell, and hit the back of his head on the concrete, then the hoop of the basketball stand fell and hit the left side of his head. He has associated dizziness (feeling off balance) and intermittent blurry vision. He describes his HA as constant throbbing on the left side of his head, but it is also sharp on the inside. He notes the pain woke him up from his sleep. He has taken 6 Excedrin and ibuprofen with no relief. Pt has a h/o migraine but describes his current symptoms are not similar. He denies LOC, loss of bowel/bladder function, urinary/bowel incontinence, nausea, vomiting, weakness, or numbness. Pt has no pertinent medical problems, NKDA, and is otherwise healthy. Pt has a PCP: [facility]. History reviewed. No pertinent past medical history.  Patient Active Problem List   Diagnosis Date Noted  . S/P ACL repair 07/15/2014  . Elevated BP 03/14/2014  . Paronychia of fourth finger of left hand 03/12/2014  . Preventative health care 11/02/2011  . Rash and other nonspecific skin eruption 11/02/2011  . COMMON MIGRAINE 04/21/2009  . BACK PAIN 01/30/2008  . INSOMNIA 09/14/2007    Past Surgical History:  Procedure Laterality Date  . ORIF FINGER FRACTURE  2011     broken in 3 places-4 pins put in       Home Medications    Prior to Admission medications   Medication Sig Start Date End Date Taking? Authorizing Provider  albuterol (PROVENTIL HFA;VENTOLIN HFA) 108 (90 BASE) MCG/ACT inhaler Inhale 2 puffs into the lungs every 6 (six) hours as needed for wheezing. 04/25/11 04/24/12  Clovis Cao, MD  aspirin EC 325 MG tablet Take 1 tablet (325 mg total) by mouth daily. 03/16/14   Melony Overly, MD  aspirin-acetaminophen-caffeine (EXCEDRIN MIGRAINE) [phone] MG per tablet Take 1 tablet by mouth every 6 (six) hours as needed for migraine. 07/10/14   Melancon, York Ram, MD  cetirizine (ZYRTEC) 10 MG tablet Take 10 mg by mouth daily.      [provider]  HYDROcodone-acetaminophen (NORCO/VICODIN) 5-325 MG tablet Take 1 tablet by mouth every 6 (six) hours as needed. 12/13/15   Billy Fischer, MD  sulfamethoxazole-trimethoprim (BACTRIM DS,SEPTRA DS) 800-160 MG per tablet Take 1 tablet by mouth 2 (two) times daily. 03/12/14   Hilton Sinclair, MD  SUMAtriptan (IMITREX) 6 MG/0.5ML SOLN injection Inject 0.5 mLs (6 mg total) into the skin once. At onset of headache 07/10/14 07/10/14  Melancon, York Ram, MD  topiramate (TOPAMAX) 25 MG tablet take 1 pill every night before bed. Increase to 2 pills after 1 week if still having headaches.     [provider]  traMADol (ULTRAM) 50 MG tablet Take 1 tablet (50 mg total) by mouth 3 (three) times daily as needed for  pain. 03/27/12   Verdie Drown, Samuel Germany, MD  venlafaxine (EFFEXOR XR) 37.5 MG 24 hr capsule Take one daily for 1 week then increase to 2 tabs. 08/24/10 08/23/12  Judithann Sheen, MD    Family History History reviewed. No pertinent family history.  Social History Social History  Substance Use Topics  . Smoking status: Current Some Day Smoker    Packs/day: 0.30    Types: Cigars  . Smokeless tobacco: Never Used  . Alcohol use No     Allergies   Patient has no known allergies.   Review of  Systems Review of Systems  Constitutional: Negative for chills and fever.  HENT: Negative for ear pain, facial swelling and sore throat.   Eyes: Positive for visual disturbance. Negative for pain, redness and itching.  Respiratory: Negative for cough, choking, chest tightness, shortness of breath, wheezing and stridor.   Cardiovascular: Negative for chest pain and palpitations.  Gastrointestinal: Negative for abdominal distention, abdominal pain, nausea and vomiting.  Musculoskeletal: Negative for arthralgias, back pain, gait problem, joint swelling, myalgias, neck pain and neck stiffness.  Skin: Negative for color change, pallor, rash and wound.  Neurological: Positive for dizziness, light-headedness and headaches. Negative for tremors, seizures, syncope, facial asymmetry, speech difficulty, weakness and numbness.     Physical Exam Updated Vital Signs BP (!) 141/99   Pulse 88   Temp 98.3 F (36.8 C) (Oral)   Resp 19   SpO2 100%   Physical Exam  Constitutional: He is oriented to person, place, and time. He appears well-developed and well-nourished. No distress.  Patient is afebrile, non-toxic appearing, sitting comfortably in chair in no acute distress.   HENT:  Head: Normocephalic.  Mouth/Throat: Oropharynx is clear and moist. No oropharyngeal exudate.  Eyes: Pupils are equal, round, and reactive to light. Conjunctivae and EOM are normal. Right eye exhibits no discharge. Left eye exhibits no discharge.  Neck: Normal range of motion. Neck supple.  Cardiovascular: Normal rate, regular rhythm and normal heart sounds.  Exam reveals no gallop and no friction rub.   No murmur heard. Pulmonary/Chest: Effort normal and breath sounds normal. No stridor. No respiratory distress. He has no wheezes. He has no rales.  Abdominal: He exhibits no distension.  Musculoskeletal: Normal range of motion. He exhibits no edema, tenderness or deformity.  Neurological: He is alert and oriented to  person, place, and time. No cranial nerve deficit or sensory deficit. He exhibits normal muscle tone. Coordination normal.  Neurologic Exam:  - Mental status: Patient is alert and cooperative. Fluent speech and words are clear. Coherent thought processes and insight is good. Patient is oriented x 4 to person, place, time and event.  - Cranial nerves:  CN III, IV, VI: pupils equally round, reactive to light both direct and conscensual. Full extra-ocular movement. CN V: motor temporalis and masseter strength intact. CN VII : muscles of facial expression intact. CN X :  midline uvula. XI strength of sternocleidomastoid and trapezius muscles 5/5, XII: tongue is midline when protruded. - Motor: No involuntary movements. Muscle tone and bulk normal throughout. Muscle strength is 5/5 in bilateral shoulder abduction, elbow flexion and extension, grip, hip extension, flexion, leg flexion and extension, ankle dorsiflexion and plantar flexion.  - Sensory: Proprioception, light tough sensation intact in all extremities.  - Cerebellar: rapid alternating movements and point to point movement intact in upper and lower extremities. Normal stance and gait.  Skin: Skin is warm and dry. He is not diaphoretic. No erythema. No  pallor.  Psychiatric: He has a normal mood and affect.  Nursing note and vitals reviewed.    ED Treatments / Results  DIAGNOSTIC STUDIES: Oxygen Saturation is 100% on RA, normal by my interpretation.    COORDINATION OF CARE: 7:54 PM Discussed treatment plan with pt at bedside and pt agreed to plan.  Labs (all labs ordered are listed, but only abnormal results are displayed) Labs Reviewed - No data to display  EKG  EKG Interpretation None       Radiology Ct Head Wo Contrast  Result Date: 08/05/2016 CLINICAL DATA:  Initial evaluation for acute headache, dizziness, struck in head. EXAM: CT HEAD WITHOUT CONTRAST TECHNIQUE: Contiguous axial images were obtained from the base of the  skull through the vertex without intravenous contrast. COMPARISON:  Prior CT from 03/12/2006. FINDINGS: Brain: Cerebral volume within normal limits for patient age. No evidence for acute intracranial hemorrhage. No findings to suggest acute large vessel territory infarct. No mass lesion, midline shift, or mass effect. Ventricles are normal in size without evidence for hydrocephalus. No extra-axial fluid collection identified. Vascular: No hyperdense vessel identified. Skull: Scalp soft tissues demonstrate no acute abnormality.Calvarium intact. Sinuses/Orbits: Globes and orbital soft tissues are within normal limits. Visualized paranasal sinuses are clear. No mastoid effusion. IMPRESSION: Normal head CT.  No acute intracranial process identified. Electronically Signed   By: Jeannine Boga M.D.   On: 08/05/2016 22:28    Procedures Procedures (including critical care time)  Medications Ordered in ED Medications  ibuprofen (ADVIL,MOTRIN) tablet 800 mg (800 mg Oral Given 08/06/16 0009)     Initial Impression / Assessment and Plan / ED Course  I have reviewed the triage vital signs and the nursing notes.  Pertinent labs & imaging results that were available during my care of the patient were reviewed by me and considered in my medical decision making (see chart for details).     Patient presents with throbbing headache. Intermittent dizziness and blurry vision which have resolved. Fall while playing basketball with head trauma yesterday.   CT head negative. Reassuring exam, normal neuro. Patient was ambulatory and well-appearing. Patient improved while in ED and was sleeping on reassessment. Stable prior to discharge.  Will discharge home with close PCP follow up. Concussion precautions discussed and resources provided.  Discussed strict return precautions and advised to return to the emergency department if experiencing any new or worsening symptoms including dizziness, visual changes, bad  headache, weakness, numbness or other new concerning symptoms.  Instructions were understood and patient agreed with discharge plan.  Final Clinical Impressions(s) / ED Diagnoses   Final diagnoses:  Injury of head, initial encounter    New Prescriptions Discharge Medication List as of 08/06/2016 12:00 AM    I personally performed the services described in this documentation, which was scribed in my presence. The recorded information has been reviewed and is accurate.    Emeline General, PA-C 08/06/16 0134    Nat Christen, MD 08/06/16 [phone]

## 2016-08-06 NOTE — Discharge Instructions (Signed)
As discussed, you may have experienced a concussion. Follow up with a primary care provider if symptoms persist.  Read the instructions provided regarding concussions. Stay well-hydrated. Tylenol or ibuprofen for headache.  Return if symptoms worsen, headache, dizziness returns, visual disturbances, nausea, vomiting, or other concerning symptoms in the meantime.

## 2016-08-06 NOTE — ED Notes (Signed)
Patient Alert and oriented X4. Stable and ambulatory. Patient verbalized understanding of the discharge instructions.  Patient belongings were taken by the patient.  

## 2016-09-21 ENCOUNTER — Encounter (HOSPITAL_COMMUNITY): Payer: Self-pay | Admitting: *Deleted

## 2016-09-21 ENCOUNTER — Emergency Department (HOSPITAL_COMMUNITY)
Admission: EM | Admit: 2016-09-21 | Discharge: 2016-09-21 | Disposition: A | Discharge status: Home / Self Care | Payer: PRIVATE HEALTH INSURANCE | Attending: Emergency Medicine | Admitting: Emergency Medicine

## 2016-09-21 DIAGNOSIS — Y939 Activity, unspecified: Secondary | ICD-10-CM | POA: Diagnosis not present

## 2016-09-21 DIAGNOSIS — Z7982 Long term (current) use of aspirin: Secondary | ICD-10-CM | POA: Diagnosis not present

## 2016-09-21 DIAGNOSIS — Y998 Other external cause status: Secondary | ICD-10-CM | POA: Insufficient documentation

## 2016-09-21 DIAGNOSIS — Z79899 Other long term (current) drug therapy: Secondary | ICD-10-CM | POA: Insufficient documentation

## 2016-09-21 DIAGNOSIS — Y929 Unspecified place or not applicable: Secondary | ICD-10-CM | POA: Diagnosis not present

## 2016-09-21 DIAGNOSIS — S0990XA Unspecified injury of head, initial encounter: Secondary | ICD-10-CM

## 2016-09-21 DIAGNOSIS — F17200 Nicotine dependence, unspecified, uncomplicated: Secondary | ICD-10-CM | POA: Diagnosis not present

## 2016-09-21 DIAGNOSIS — W109XXA Fall (on) (from) unspecified stairs and steps, initial encounter: Secondary | ICD-10-CM | POA: Diagnosis not present

## 2016-09-21 MED ORDER — CYCLOBENZAPRINE HCL 10 MG PO TABS: 10.0000 mg | ORAL_TABLET | Freq: Two times a day (BID) | ORAL | 0 refills | Status: DC | PRN

## 2016-09-21 NOTE — Discharge Instructions (Signed)
Keep wound clean and dry. You can apply ointment on the area to help with healing.  Take Ibuprofen or Naproxen for headache Return for worsening symptoms

## 2016-09-21 NOTE — ED Triage Notes (Signed)
Pt reports hx of leg injury and was walking up the stairs when his leg gave out. Pt hit his forehead on concrete wall. Denies loc. Has abrasion on his forehead and headache. No acute distress is noted at triage.

## 2016-09-21 NOTE — ED Notes (Signed)
See EDP secondary assessment.  

## 2016-09-21 NOTE — ED Provider Notes (Signed)
[facility] DEPT Provider Note   CSN: [id number] Arrival date & time: 09/21/16  1022     History   Chief Complaint Chief Complaint  Patient presents with  . Fall  . Head Injury    HPI Jeremiah Mason is a 31 y.o. male who presents with a head injury. He states that he was walking up some stairs when his leg gave out and he fell forward and hit his forehead on a brick wall. The incident occurred about 2.5 hours ago. He has a headache and some intermittent blurry vision in his right eye. He also reports some right sided neck pain. He took an Excedrin without significant relief. He denies LOC, dizziness, vomiting, numbness/weakness, inability to walk.  HPI  History reviewed. No pertinent past medical history.  Patient Active Problem List   Diagnosis Date Noted  . S/P ACL repair 07/15/2014  . Elevated BP 03/14/2014  . Paronychia of fourth finger of left hand 03/12/2014  . Preventative health care 11/02/2011  . Rash and other nonspecific skin eruption 11/02/2011  . COMMON MIGRAINE 04/21/2009  . BACK PAIN 01/30/2008  . INSOMNIA 09/14/2007    Past Surgical History:  Procedure Laterality Date  . ORIF FINGER FRACTURE  2011   broken in 3 places-4 pins put in       Home Medications    Prior to Admission medications   Medication Sig Start Date End Date Taking? Authorizing Provider  albuterol (PROVENTIL HFA;VENTOLIN HFA) 108 (90 BASE) MCG/ACT inhaler Inhale 2 puffs into the lungs every 6 (six) hours as needed for wheezing. 04/25/11 04/24/12  Clovis Cao, MD  aspirin EC 325 MG tablet Take 1 tablet (325 mg total) by mouth daily. 03/16/14   Melony Overly, MD  aspirin-acetaminophen-caffeine (EXCEDRIN MIGRAINE) [phone] MG per tablet Take 1 tablet by mouth every 6 (six) hours as needed for migraine. 07/10/14   Melancon, York Ram, MD  cetirizine (ZYRTEC) 10 MG tablet Take 10 mg by mouth daily.      [provider]  HYDROcodone-acetaminophen (NORCO/VICODIN) 5-325 MG tablet  Take 1 tablet by mouth every 6 (six) hours as needed. 12/13/15   Billy Fischer, MD  sulfamethoxazole-trimethoprim (BACTRIM DS,SEPTRA DS) 800-160 MG per tablet Take 1 tablet by mouth 2 (two) times daily. 03/12/14   Hilton Sinclair, MD  SUMAtriptan (IMITREX) 6 MG/0.5ML SOLN injection Inject 0.5 mLs (6 mg total) into the skin once. At onset of headache 07/10/14 07/10/14  Melancon, York Ram, MD  topiramate (TOPAMAX) 25 MG tablet take 1 pill every night before bed. Increase to 2 pills after 1 week if still having headaches.     [provider]  traMADol (ULTRAM) 50 MG tablet Take 1 tablet (50 mg total) by mouth 3 (three) times daily as needed for pain. 03/27/12   [place], Samuel Germany, MD  venlafaxine (EFFEXOR XR) 37.5 MG 24 hr capsule Take one daily for 1 week then increase to 2 tabs. 08/24/10 08/23/12  Judithann Sheen, MD    Family History History reviewed. No pertinent family history.  Social History Social History  Substance Use Topics  . Smoking status: Current Some Day Smoker    Packs/day: 0.30    Types: Cigars  . Smokeless tobacco: Never Used  . Alcohol use No     Allergies   Patient has no known allergies.   Review of Systems Review of Systems  Skin: Positive for wound.  Neurological: Positive for headaches. Negative for dizziness, syncope, weakness and numbness.  Physical Exam Updated Vital Signs BP (!) 136/107 (BP Location: Left Arm)   Pulse 79   Temp 98.5 F (36.9 C) (Oral)   Resp 16   SpO2 99%   Physical Exam  Constitutional: He is oriented to person, place, and time. He appears well-developed and well-nourished. No distress.  HENT:  Head: Normocephalic and atraumatic.  Eyes: Pupils are equal, round, and reactive to light. Conjunctivae are normal. Right eye exhibits no discharge. Left eye exhibits no discharge. No scleral icterus.  Neck: Normal range of motion.  Cardiovascular: Normal rate.   Pulmonary/Chest: Effort normal. No respiratory distress.    Abdominal: He exhibits no distension.  Neurological: He is alert and oriented to person, place, and time.  Mental Status:  Alert, oriented, thought content appropriate, able to give a coherent history. Speech fluent without evidence of aphasia. Able to follow 2 step commands without difficulty.  Cranial Nerves:  II:  Peripheral visual fields grossly normal, pupils equal, round, reactive to light III,IV, VI: ptosis not present, extra-ocular motions intact bilaterally  V,VII: smile symmetric, facial light touch sensation equal VIII: hearing grossly normal to voice  X: uvula elevates symmetrically  XI: bilateral shoulder shrug symmetric and strong XII: midline tongue extension without fassiculations Motor:  Normal tone. 5/5 in upper and lower extremities bilaterally including strong and equal grip strength and dorsiflexion/plantar flexion Sensory: Pinprick and light touch normal in all extremities.  Cerebellar: normal finger-to-nose with bilateral upper extremities Gait: normal gait and balance CV: distal pulses palpable throughout    Skin: Skin is warm and dry.  Abrasion of right side of forehead  Psychiatric: He has a normal mood and affect. His behavior is normal.  Nursing note and vitals reviewed.    ED Treatments / Results  Labs (all labs ordered are listed, but only abnormal results are displayed) Labs Reviewed - No data to display  EKG  EKG Interpretation None       Radiology No results found.  Procedures Procedures (including critical care time)  Medications Ordered in ED Medications - No data to display   Initial Impression / Assessment and Plan / ED Course  I have reviewed the triage vital signs and the nursing notes.  Pertinent labs & imaging results that were available during my care of the patient were reviewed by me and considered in my medical decision making (see chart for details).  31 year old male presents with a head injury and abrasion after  hitting his head on a wall. Neuro exam is unremarkable. He is low risk per [place] head Ct rule. Advised local wound care and to return for worsening symptoms. Return precautions given.  Final Clinical Impressions(s) / ED Diagnoses   Final diagnoses:  Minor head injury, initial encounter    New Prescriptions New Prescriptions   No medications on file     Iris Pert 09/21/16 1243    Nat Christen, MD 09/24/16 [phone]

## 2016-09-29 ENCOUNTER — Encounter: Payer: PRIVATE HEALTH INSURANCE | Admitting: Family Medicine

## 2016-10-07 ENCOUNTER — Ambulatory Visit (INDEPENDENT_AMBULATORY_CARE_PROVIDER_SITE_OTHER): Payer: PRIVATE HEALTH INSURANCE | Admitting: Family Medicine

## 2016-10-07 ENCOUNTER — Encounter: Payer: Self-pay | Admitting: Family Medicine

## 2016-10-07 VITALS — BP 124/90 | HR 88 | Temp 98.8°F | Wt 175.0 lb

## 2016-10-07 DIAGNOSIS — Z9889 Other specified postprocedural states: Secondary | ICD-10-CM | POA: Diagnosis not present

## 2016-10-07 DIAGNOSIS — G43009 Migraine without aura, not intractable, without status migrainosus: Secondary | ICD-10-CM | POA: Diagnosis not present

## 2016-10-07 DIAGNOSIS — D229 Melanocytic nevi, unspecified: Secondary | ICD-10-CM | POA: Diagnosis not present

## 2016-10-07 DIAGNOSIS — J45990 Exercise induced bronchospasm: Secondary | ICD-10-CM | POA: Diagnosis not present

## 2016-10-07 DIAGNOSIS — M25561 Pain in right knee: Secondary | ICD-10-CM | POA: Diagnosis not present

## 2016-10-07 DIAGNOSIS — G8929 Other chronic pain: Secondary | ICD-10-CM

## 2016-10-07 MED ORDER — ALBUTEROL SULFATE HFA 108 (90 BASE) MCG/ACT IN AERS: 2.0000 | INHALATION_SPRAY | Freq: Four times a day (QID) | RESPIRATORY_TRACT | 1 refills | Status: AC | PRN

## 2016-10-07 MED ORDER — TOPIRAMATE 50 MG PO TABS: 50.0000 mg | ORAL_TABLET | Freq: Every day | ORAL | 0 refills | Status: DC

## 2016-10-07 NOTE — Progress Notes (Signed)
    Subjective:  Jeremiah Mason is a 31 y.o. male who presents to the [facility] today for annual wellness exam  HPI:  Mole - Noticed a small dark round "bump-like" mole on his R upper torso on lateral side about 6 months ago.  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 - R knee has given out in the past causing him to fall, one time he states he fell on his head - stretching helps  migranes - Has chronic migraines has approximately 2x per day  - taking OTC excedrin which helps but taking several doses throughout the day but the caffeine is adversely affecting his insomnia, making it more difficult to sleep at night - sometimes a migraine will wake him up from sleep - used to have sumatriptan injections which never worked well for him and he is not interested in [facility] at this time due to expense - taking topamax 25mg  qd in the mornings, which he feels like does help and needs refills   PMH: migraines, insomnia, chronic back pain PSH: R knee ACL repair, R hand 5th finger fracture repair Fam hx: Mother-DM, HTN Social Hx: Rare use of EtOH, no drug use, former smoker quit in 2016  ROS: Per HPI   Objective:  Physical Exam: BP 124/90   Pulse 88   Temp 98.8 F (37.1 C) (Oral)   Wt 175 lb (79.4 kg)   SpO2 99%   BMI 27.41 kg/m   Gen: NAD, resting comfortably CV: RRR with no murmurs appreciated Pulm: NWOB, CTAB with no crackles, wheezes, or rhonchi GI: Normal bowel sounds present. Soft, Nontender, Nondistended. MSK: no edema, cyanosis, or clubbing noted. R knee  without ligamentous laxity or tenderness to palpation. Neg ant and post drawer. Neg mcmurray Skin: small raised dark papule on R upper torso on lateral side, approximately 64mm in diameter, no irregularity Neuro: grossly normal, moves all extremities Psych: Normal affect and thought content   Assessment/Plan:  Migraine without aura Given frequency of patient's migraines, will increased topamax to 50mg  qd. Recommended dose for migraine ppx is 100mg  qd which I discussed with patient today. Will not re-trial sumatriptan at this time but if patient does not have improvement with topamax at recommended dose, will re-visit with patient. Follow up in 2 weeks.  S/P ACL repair No ligamentous laxity on exam today but given patient's concern for weakness, will refer to physical therapy to see if exercises can help strength surrounding musculature. Also discussed with patient about possibly going back to see orthopedic surgeon but he opted to try PT first.  Change in skin mole Given patient reported history of mole that has been changing in appearance over the last 6 months, discussed with patient that should biopsy and send for pathology. Patient to schedule appointment for shave biopsy with me in 2 weeks.   Bufford Lope, DO PGY-2, [facility] 10/07/2016 3:42 PM

## 2016-10-07 NOTE — Patient Instructions (Addendum)
It was good to see you today!  For your knee pain, - We recommend physical therapy, please let us know if you have not heard back in 2 weeks about scheduling.  For your migraines, please increase topamax to 50mg  daily. Most patient find the most relief at a dose of 100mg  daily so we can increase at next visit if needed.  Please check-out at the front desk before leaving the clinic. Make an appointment in  2 weeks for migraine recheck and skin shave for mole removal.  Please bring all of your medications with you to each visit.   Sign up for My Chart to have easy access to your labs results, and communication with your primary care physician.  Feel free to call with any questions or concerns at any time, at [phone].   Take care,  Dr. Bufford Lope, [place]

## 2016-10-09 DIAGNOSIS — J45990 Exercise induced bronchospasm: Secondary | ICD-10-CM | POA: Insufficient documentation

## 2016-10-09 DIAGNOSIS — D229 Melanocytic nevi, unspecified: Secondary | ICD-10-CM | POA: Insufficient documentation

## 2016-10-09 NOTE — Assessment & Plan Note (Signed)
Given frequency of patient's migraines, will increased topamax to 50mg  qd. Recommended dose for migraine ppx is 100mg  qd which I discussed with patient today. Will not re-trial sumatriptan at this time but if patient does not have improvement with topamax at recommended dose, will re-visit with patient. Follow up in 2 weeks.

## 2016-10-09 NOTE — Assessment & Plan Note (Signed)
No ligamentous laxity on exam today but given patient's concern for weakness, will refer to physical therapy to see if exercises can help strength surrounding musculature. Also discussed with patient about possibly going back to see orthopedic surgeon but he opted to try PT first.

## 2016-10-09 NOTE — Assessment & Plan Note (Signed)
Given patient reported history of mole that has been changing in appearance over the last 6 months, discussed with patient that should biopsy and send for pathology. Patient to schedule appointment for shave biopsy with me in 2 weeks.

## 2017-03-26 ENCOUNTER — Emergency Department (HOSPITAL_COMMUNITY): Payer: PRIVATE HEALTH INSURANCE

## 2017-03-26 ENCOUNTER — Emergency Department (HOSPITAL_COMMUNITY)
Admission: EM | Admit: 2017-03-26 | Discharge: 2017-03-26 | Disposition: A | Discharge status: Home / Self Care | Payer: PRIVATE HEALTH INSURANCE | Attending: Emergency Medicine | Admitting: Emergency Medicine

## 2017-03-26 DIAGNOSIS — Y9389 Activity, other specified: Secondary | ICD-10-CM | POA: Insufficient documentation

## 2017-03-26 DIAGNOSIS — S60222A Contusion of left hand, initial encounter: Secondary | ICD-10-CM

## 2017-03-26 DIAGNOSIS — W230XXA Caught, crushed, jammed, or pinched between moving objects, initial encounter: Secondary | ICD-10-CM | POA: Insufficient documentation

## 2017-03-26 DIAGNOSIS — S61211A Laceration without foreign body of left index finger without damage to nail, initial encounter: Secondary | ICD-10-CM | POA: Insufficient documentation

## 2017-03-26 DIAGNOSIS — Y998 Other external cause status: Secondary | ICD-10-CM | POA: Insufficient documentation

## 2017-03-26 DIAGNOSIS — Z23 Encounter for immunization: Secondary | ICD-10-CM | POA: Insufficient documentation

## 2017-03-26 DIAGNOSIS — Y929 Unspecified place or not applicable: Secondary | ICD-10-CM | POA: Insufficient documentation

## 2017-03-26 DIAGNOSIS — Z7982 Long term (current) use of aspirin: Secondary | ICD-10-CM | POA: Insufficient documentation

## 2017-03-26 DIAGNOSIS — Z87891 Personal history of nicotine dependence: Secondary | ICD-10-CM | POA: Insufficient documentation

## 2017-03-26 DIAGNOSIS — J45990 Exercise induced bronchospasm: Secondary | ICD-10-CM | POA: Insufficient documentation

## 2017-03-26 MED ORDER — TETANUS-DIPHTH-ACELL PERTUSSIS 5-2.5-18.5 LF-MCG/0.5 IM SUSP
0.5000 mL | Freq: Once | INTRAMUSCULAR | Status: AC
  Administered 2017-03-26: 0.5 mL via INTRAMUSCULAR
  Filled 2017-03-26: qty 0.5

## 2017-03-26 MED ORDER — CEPHALEXIN 500 MG PO CAPS: 500.0000 mg | ORAL_CAPSULE | Freq: Two times a day (BID) | ORAL | 0 refills | Status: DC

## 2017-03-26 MED ORDER — IBUPROFEN 800 MG PO TABS: 800.0000 mg | ORAL_TABLET | Freq: Three times a day (TID) | ORAL | 0 refills | Status: DC

## 2017-03-26 MED ORDER — LIDOCAINE-EPINEPHRINE (PF) 2 %-1:200000 IJ SOLN
10.0000 mL | Freq: Once | INTRAMUSCULAR | Status: AC
  Administered 2017-03-26: 10 mL
  Filled 2017-03-26: qty 20

## 2017-03-26 MED ORDER — HYDROCODONE-ACETAMINOPHEN 5-325 MG PO TABS
1.0000 | ORAL_TABLET | Freq: Once | ORAL | Status: AC
  Administered 2017-03-26: 1 via ORAL
  Filled 2017-03-26: qty 1

## 2017-03-26 NOTE — ED Notes (Signed)
ED Provider at bedside. 

## 2017-03-26 NOTE — ED Provider Notes (Signed)
[facility] EMERGENCY DEPARTMENT Provider Note   CSN: [id number] Arrival date & time: 03/26/17  0028     History   Chief Complaint Chief Complaint  Patient presents with  . Hand Pain    HPI Jeremiah Mason is a 32 y.o. male with no major medical history presents to the Emergency Department complaining of gradual, acute, persistent laceration to the left index finger.  Patient reports that he had a flat tire and was changing it when the car fell off the jack.  He reports his hand was smashed between the car and the jack.  He reports it only caught his left index finger.  He states he was able to use another Barnabas Lister to remove the car from his finger.  Patient reports this happened around 10:30 PM.  He states initially no sensation in the left finger at all but this has improved.  No treatments prior to arrival.  Last tetanus shot was more than 5 years ago.  Patient without history of diabetes or immunocompromise.  Patient denies weakness in his finger or hand.  No additional injuries.   HPI  Past Medical History:  Diagnosis Date  . 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 Junius Roads): helped but too expensive   Meds: Percocet/Vicodin (helped), OTC (ibuprofen)   . COMMON MIGRAINE 04/21/2009   Qualifier: Diagnosis of  By: Jeannine Kitten MD, Rodman Key    . INSOMNIA 09/14/2007   Qualifier: Diagnosis of  By: Zebedee Iba NP, Manuela Schwartz    . S/P ACL repair 2016   R knee    Patient Active Problem List   Diagnosis Date Noted  . Exercise-induced asthma 10/09/2016  . Change in skin mole 10/09/2016  . S/P ACL repair 07/15/2014  . Elevated BP 03/14/2014  . Preventative health care 11/02/2011  . Migraine without aura 04/21/2009  . BACK PAIN 01/30/2008  . INSOMNIA 09/14/2007    Past Surgical History:  Procedure Laterality Date  . ANTERIOR CRUCIATE LIGAMENT  REPAIR Right 2016  . ORIF FINGER FRACTURE Right 2011   5th finger. broken in 3 places-4 pins put in       Home Medications    Prior to Admission medications   Medication Sig Start Date End Date Taking? Authorizing Provider  albuterol (PROVENTIL HFA;VENTOLIN HFA) 108 (90 Base) MCG/ACT inhaler Inhale 2 puffs into the lungs every 6 (six) hours as needed for wheezing. 10/07/16 10/07/17  Bufford Lope, DO  aspirin-acetaminophen-caffeine (EXCEDRIN MIGRAINE) [phone] MG per tablet Take 1 tablet by mouth every 6 (six) hours as needed for migraine. 07/10/14   Melancon, York Ram, MD  cephALEXin (KEFLEX) 500 MG capsule Take 1 capsule (500 mg total) by mouth 2 (two) times daily for 7 days. 03/26/17 04/02/17  Mahnoor Mathisen, Jarrett Soho, PA-C  cetirizine (ZYRTEC) 10 MG tablet Take 10 mg by mouth daily.      [provider]  ibuprofen (ADVIL,MOTRIN) 800 MG tablet Take 1 tablet (800 mg total) by mouth 3 (three) times daily. 03/26/17   Qadir Folks, Jarrett Soho, PA-C  topiramate (TOPAMAX) 50 MG tablet Take 1 tablet (50 mg total) by mouth daily. 10/07/16   Bufford Lope, DO    Family History Family History  Problem Relation Age of Onset  . Diabetes Mother   . Hypertension Mother     Social History Social History  Tobacco Use  . Smoking status: Former Smoker    Types: Cigars, Cigarettes    Last attempt to quit: 09/27/2014    Years since quitting: 2.4  . Smokeless tobacco: Never Used  . Tobacco comment: 10 pack year history  Substance Use Topics  . Alcohol use: Yes    Comment: very rare  . Drug use: No     Allergies   Patient has no known allergies.   Review of Systems Review of Systems  Constitutional: Negative for fever.  Gastrointestinal: Negative for nausea and vomiting.  Skin: Positive for wound.  Allergic/Immunologic: Negative for immunocompromised state.  Neurological: Negative for weakness and numbness.  Hematological: Does not bruise/bleed easily.  Psychiatric/Behavioral: The  patient is not nervous/anxious.      Physical Exam Updated Vital Signs BP (!) 159/111 (BP Location: Right Arm)   Pulse 84   Temp 98.3 F (36.8 C) (Oral)   Resp 18   Ht 5\' 10"  (1.778 m)   Wt 81.6 kg (180 lb)   SpO2 99%   BMI 25.83 kg/m   Physical Exam  Constitutional: He is oriented to person, place, and time. He appears well-developed and well-nourished. No distress.  HENT:  Head: Normocephalic and atraumatic.  Eyes: Conjunctivae are normal. No scleral icterus.  Neck: Normal range of motion.  Cardiovascular: Normal rate, regular rhythm, normal heart sounds and intact distal pulses.  No murmur heard. Capillary refill < 3 sec  Pulmonary/Chest: Effort normal and breath sounds normal. No respiratory distress.  Musculoskeletal: Normal range of motion. He exhibits no edema.  Full range of motion of the left index finger (DIP, PIP and MCP) and all fingers of the left hand  Neurological: He is alert and oriented to person, place, and time.  Sensation: Intact to normal and two-point discrimination in the tip of the left pointer finger; sensation decreased significantly around the site of the wound. Strength: 5/5 with flexion and extension of the left index finger  Skin: Skin is warm and dry. He is not diaphoretic.  3.0 centimeters laceration over the the palmar side of the MCP 1.0 centimeter laceration to the lateral side of the left index finger between the PIP and MCP Several additional abrasions circumferentially around the left index finger between the PIP and MCP  Psychiatric: He has a normal mood and affect.  Nursing note and vitals reviewed.    ED Treatments / Results  Labs (all labs ordered are listed, but only abnormal results are displayed) Labs Reviewed - No data to display  EKG  EKG Interpretation None       Radiology Dg Finger Index Left  Result Date: 03/26/2017 CLINICAL DATA:  Laceration to the index finger EXAM: LEFT INDEX FINGER 2+V COMPARISON:  None.  FINDINGS: There is no evidence of fracture or dislocation. There is no evidence of arthropathy or other focal bone abnormality. Soft tissues are unremarkable. IMPRESSION: Negative. Electronically Signed   By: Donavan Foil M.D.   On: 03/26/2017 01:09    Procedures .Marland KitchenLaceration Repair Date/Time: 03/26/2017 4:22 AM Performed by: Abigail Butts, PA-C Authorized by: Abigail Butts, PA-C   Consent:    Consent obtained:  Verbal   Consent given by:  Patient   Risks discussed:  Infection, pain, poor cosmetic result, poor wound healing and need for additional repair Anesthesia (see MAR for exact dosages):    Anesthesia method:  Local infiltration   Local anesthetic:  Lidocaine 2% WITH epi (4) Laceration details:    Location:  Finger  Finger location:  L index finger   Length (cm):  3 Repair type:    Repair type:  Simple Pre-procedure details:    Preparation:  Patient was prepped and draped in usual sterile fashion Exploration:    Hemostasis achieved with:  Epinephrine and direct pressure   Wound exploration: wound explored through full range of motion and entire depth of wound probed and visualized   Treatment:    Area cleansed with:  Saline   Amount of cleaning:  Standard   Irrigation solution:  Sterile water   Irrigation method:  Syringe Skin repair:    Repair method:  Sutures   Suture size:  5-0   Suture material:  Prolene   Suture technique:  Simple interrupted   Number of sutures:  5 Approximation:    Approximation:  Close   Vermilion border: well-aligned   Post-procedure details:    Dressing:  Non-adherent dressing and splint for protection   Patient tolerance of procedure:  Tolerated well, no immediate complications .Marland KitchenLaceration Repair Date/Time: 03/26/2017 4:23 AM Performed by: Abigail Butts, PA-C Authorized by: Abigail Butts, PA-C   Consent:    Consent obtained:  Verbal   Consent given by:  Patient   Risks discussed:  Infection, need for  additional repair, pain, poor wound healing and poor cosmetic result   Alternatives discussed:  No treatment Anesthesia (see MAR for exact dosages):    Anesthesia method:  Local infiltration   Local anesthetic:  Lidocaine 2% WITH epi Laceration details:    Location:  Finger   Finger location:  L index finger   Length (cm):  1 Repair type:    Repair type:  Simple Pre-procedure details:    Preparation:  Patient was prepped and draped in usual sterile fashion Exploration:    Hemostasis achieved with:  Epinephrine and direct pressure   Wound exploration: wound explored through full range of motion and entire depth of wound probed and visualized   Treatment:    Area cleansed with:  Saline   Amount of cleaning:  Standard   Irrigation solution:  Sterile water   Irrigation method:  Syringe Skin repair:    Repair method:  Sutures   Suture size:  5-0   Suture material:  Prolene   Suture technique:  Simple interrupted   Number of sutures:  2 Approximation:    Approximation:  Close   Vermilion border: well-aligned   Post-procedure details:    Dressing:  Non-adherent dressing and splint for protection   Patient tolerance of procedure:  Tolerated well, no immediate complications   (including critical care time)  Medications Ordered in ED Medications  HYDROcodone-acetaminophen (NORCO/VICODIN) 5-325 MG per tablet 1 tablet (1 tablet Oral Given 03/26/17 0300)  lidocaine-EPINEPHrine (XYLOCAINE W/EPI) 2 %-1:200000 (PF) injection 10 mL (10 mLs Infiltration Given by Other 03/26/17 0159)  Tdap (BOOSTRIX) injection 0.5 mL (0.5 mLs Intramuscular Given 03/26/17 0300)     Initial Impression / Assessment and Plan / ED Course  I have reviewed the triage vital signs and the nursing notes.  Pertinent labs & imaging results that were available during my care of the patient were reviewed by me and considered in my medical decision making (see chart for details).     Pressure irrigation performed.  Wound explored and base of wound visualized in a bloodless field without evidence of foreign body.  Laceration occurred < 8 hours prior to repair which was well tolerated. Tdap updated.  Pt has no comorbidities to effect normal wound healing. Pt  discharged with keflex due to location and type of wound.  Discussed suture home care with patient and answered questions. Pt to follow-up for wound check and suture removal in 7 days; they are to return to the ED sooner for signs of infection. Pt is hemodynamically stable with no complaints prior to dc.   Patient noted to be hypertensive in the emergency department.  No signs of hypertensive urgency.  Discussed with patient the need for close follow-up and management by their primary care physician.   BP (!) 159/111 (BP Location: Right Arm)   Pulse 84   Temp 98.3 F (36.8 C) (Oral)   Resp 18   Ht 5\' 10"  (1.778 m)   Wt 81.6 kg (180 lb)   SpO2 99%   BMI 25.83 kg/m     Final Clinical Impressions(s) / ED Diagnoses   Final diagnoses:  Contusion of left hand, initial encounter  Laceration of left index finger without foreign body without damage to nail, initial encounter    ED Discharge Orders        Ordered    ibuprofen (ADVIL,MOTRIN) 800 MG tablet  3 times daily     03/26/17 0425    cephALEXin (KEFLEX) 500 MG capsule  2 times daily     03/26/17 0425       Kearie Mennen, [place], PA-C 03/26/17 0431    Orpah Greek, MD 03/26/17 [phone]

## 2017-03-26 NOTE — Discharge Instructions (Signed)

## 2017-03-26 NOTE — ED Triage Notes (Signed)
Pt arrives with complaints of L first digit pain after getting his finger stuck on a jack. Two wounds about the size of a pea on proximal knuckle. States decreased sensation, slight decreased mobility. Reports tdap utd.

## 2017-04-01 ENCOUNTER — Ambulatory Visit (HOSPITAL_COMMUNITY)
Admission: EM | Admit: 2017-04-01 | Discharge: 2017-04-01 | Disposition: A | Discharge status: Home / Self Care | Payer: Self-pay | Attending: Family Medicine | Admitting: Family Medicine

## 2017-04-01 ENCOUNTER — Other Ambulatory Visit: Payer: Self-pay

## 2017-04-01 ENCOUNTER — Encounter (HOSPITAL_COMMUNITY): Payer: Self-pay | Admitting: Emergency Medicine

## 2017-04-01 ENCOUNTER — Ambulatory Visit (INDEPENDENT_AMBULATORY_CARE_PROVIDER_SITE_OTHER): Payer: Self-pay

## 2017-04-01 DIAGNOSIS — R69 Illness, unspecified: Secondary | ICD-10-CM

## 2017-04-01 DIAGNOSIS — Z87891 Personal history of nicotine dependence: Secondary | ICD-10-CM | POA: Insufficient documentation

## 2017-04-01 DIAGNOSIS — R05 Cough: Secondary | ICD-10-CM

## 2017-04-01 DIAGNOSIS — J111 Influenza due to unidentified influenza virus with other respiratory manifestations: Secondary | ICD-10-CM | POA: Insufficient documentation

## 2017-04-01 DIAGNOSIS — Z4802 Encounter for removal of sutures: Secondary | ICD-10-CM | POA: Insufficient documentation

## 2017-04-01 LAB — POCT RAPID STREP A: STREPTOCOCCUS, GROUP A SCREEN (DIRECT): NEGATIVE

## 2017-04-01 MED ORDER — HYDROCODONE-HOMATROPINE 5-1.5 MG/5ML PO SYRP: 5.0000 mL | ORAL_SOLUTION | Freq: Four times a day (QID) | ORAL | 0 refills | Status: DC | PRN

## 2017-04-01 MED ORDER — OSELTAMIVIR PHOSPHATE 75 MG PO CAPS: 75.0000 mg | ORAL_CAPSULE | Freq: Two times a day (BID) | ORAL | 0 refills | Status: DC

## 2017-04-01 NOTE — ED Triage Notes (Signed)
Patient seen by provider prior to nursing staff

## 2017-04-01 NOTE — ED Provider Notes (Signed)
[facility]   [id number] 04/01/17 Arrival Time: 1813   SUBJECTIVE:  Jeremiah Mason is a 32 y.o. male who presents to the urgent care with complaint of cough, chills, sweats and sore throat over night.  Also would like sutures removed from left hand.  Had laceration 6 days ago when car jack fell no left hand.  No issues since.  Works for [employer].   Past Medical History:  Diagnosis Date  . 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 Junius Roads): helped but too expensive   Meds: Percocet/Vicodin (helped), OTC (ibuprofen)   . COMMON MIGRAINE 04/21/2009   Qualifier: Diagnosis of  By: Jeannine Kitten MD, Rodman Key    . INSOMNIA 09/14/2007   Qualifier: Diagnosis of  By: Zebedee Iba NP, Manuela Schwartz    . S/P ACL repair 2016   R knee   Family History  Problem Relation Age of Onset  . Diabetes Mother   . Hypertension Mother    Social History   Socioeconomic History  . Marital status: Single    Spouse name: Not on file  . Number of children: Not on file  . Years of education: Not on file  . Highest education level: Not on file  Social Needs  . Financial resource strain: Not on file  . Food insecurity - worry: Not on file  . Food insecurity - inability: Not on file  . Transportation needs - medical: Not on file  . Transportation needs - non-medical: Not on file  Occupational History    Comment: works a International aid/development worker  . Smoking status: Former Smoker    Types: Cigars, Cigarettes    Last attempt to quit: 09/27/2014    Years since quitting: 2.5  . Smokeless tobacco: Never Used  . Tobacco comment: 10 pack year history  Substance and Sexual Activity  . Alcohol use: Yes    Comment: very rare  . Drug use: No  . Sexual activity: Yes  Other Topics Concern  . Not on file  Social History Narrative   Exercise regularly 2x a week, plays basketball   No  outpatient medications have been marked as taking for the 04/01/17 encounter [facility] Encounter).   No Known Allergies    ROS: As per HPI, remainder of ROS negative.   OBJECTIVE:   Vitals:   04/01/17 1924  BP: 119/89  Pulse: 94  Resp: 16  Temp: 98.5 F (36.9 C)  TempSrc: Oral  SpO2: 99%     General appearance: alert; no distress Eyes: PERRL; EOMI; conjunctiva normal HENT: normocephalic; atraumatic; TMs normal, canal normal, external ears normal without trauma; nasal mucosa normal; oral mucosa very red posterior pharynx. Neck: supple Lungs: loud ronchi left chest Heart: regular rate and rhythm Abdomen: soft, non-tender; bowel sounds normal; no masses or organomegaly; no guarding or rebound tenderness Back: no CVA tenderness Extremities: no cyanosis or edema; symmetrical with no gross deformities Skin: warm and dry; left hand laceration appears to be healing well with no STS or redness, nontender with FROM. Neurologic: normal gait; grossly normal Psychological: alert and cooperative; normal mood and affect      Labs:  Results for orders placed or performed during the hospital encounter of 04/01/17  POCT rapid strep A [facility])  Result Value Ref Range   Streptococcus, Group A Screen (Direct)  NEGATIVE NEGATIVE    Labs Reviewed  CULTURE, GROUP A STREP [facility])  POCT RAPID STREP A    No results found.     ASSESSMENT & PLAN:  1. Influenza-like illness   2. Encounter for removal of sutures     Meds ordered this encounter  Medications  . oseltamivir (TAMIFLU) 75 MG capsule    Sig: Take 1 capsule (75 mg total) by mouth every 12 (twelve) hours.    Dispense:  10 capsule    Refill:  0  . HYDROcodone-homatropine (HYDROMET) 5-1.5 MG/5ML syrup    Sig: Take 5 mLs by mouth every 6 (six) hours as needed for cough.    Dispense:  100 mL    Refill:  0    Reviewed expectations re: course of current medical issues. Questions answered. Outlined signs and  symptoms indicating need for more acute intervention. Patient verbalized understanding. After Visit Summary given.    Procedures:      Robyn Haber, MD 04/01/17 [phone]

## 2017-04-04 LAB — CULTURE, GROUP A STREP (THRC)

## 2018-02-11 ENCOUNTER — Emergency Department (HOSPITAL_COMMUNITY): Payer: Self-pay

## 2018-02-11 ENCOUNTER — Encounter (HOSPITAL_COMMUNITY): Payer: Self-pay

## 2018-02-11 ENCOUNTER — Inpatient Hospital Stay (HOSPITAL_COMMUNITY)
Admission: EM | Admit: 2018-02-11 | Discharge: 2018-02-15 | DRG: 804 | Disposition: A | Discharge status: Home / Self Care | Payer: Self-pay | Attending: Family Medicine | Admitting: Family Medicine

## 2018-02-11 DIAGNOSIS — R079 Chest pain, unspecified: Secondary | ICD-10-CM

## 2018-02-11 DIAGNOSIS — D860 Sarcoidosis of lung: Secondary | ICD-10-CM | POA: Diagnosis present

## 2018-02-11 DIAGNOSIS — R0602 Shortness of breath: Secondary | ICD-10-CM

## 2018-02-11 DIAGNOSIS — R0789 Other chest pain: Secondary | ICD-10-CM | POA: Diagnosis present

## 2018-02-11 DIAGNOSIS — J181 Lobar pneumonia, unspecified organism: Secondary | ICD-10-CM | POA: Diagnosis present

## 2018-02-11 DIAGNOSIS — L989 Disorder of the skin and subcutaneous tissue, unspecified: Secondary | ICD-10-CM | POA: Diagnosis present

## 2018-02-11 DIAGNOSIS — R9389 Abnormal findings on diagnostic imaging of other specified body structures: Secondary | ICD-10-CM | POA: Diagnosis present

## 2018-02-11 DIAGNOSIS — R911 Solitary pulmonary nodule: Secondary | ICD-10-CM | POA: Diagnosis present

## 2018-02-11 DIAGNOSIS — R590 Localized enlarged lymph nodes: Principal | ICD-10-CM | POA: Diagnosis present

## 2018-02-11 DIAGNOSIS — R918 Other nonspecific abnormal finding of lung field: Secondary | ICD-10-CM

## 2018-02-11 DIAGNOSIS — Z8249 Family history of ischemic heart disease and other diseases of the circulatory system: Secondary | ICD-10-CM

## 2018-02-11 DIAGNOSIS — R0609 Other forms of dyspnea: Secondary | ICD-10-CM | POA: Diagnosis present

## 2018-02-11 DIAGNOSIS — Z79891 Long term (current) use of opiate analgesic: Secondary | ICD-10-CM

## 2018-02-11 DIAGNOSIS — Z833 Family history of diabetes mellitus: Secondary | ICD-10-CM

## 2018-02-11 DIAGNOSIS — J45990 Exercise induced bronchospasm: Secondary | ICD-10-CM | POA: Diagnosis present

## 2018-02-11 DIAGNOSIS — M549 Dorsalgia, unspecified: Secondary | ICD-10-CM | POA: Diagnosis present

## 2018-02-11 DIAGNOSIS — I10 Essential (primary) hypertension: Secondary | ICD-10-CM | POA: Diagnosis present

## 2018-02-11 DIAGNOSIS — Z87891 Personal history of nicotine dependence: Secondary | ICD-10-CM

## 2018-02-11 DIAGNOSIS — G4700 Insomnia, unspecified: Secondary | ICD-10-CM | POA: Diagnosis present

## 2018-02-11 DIAGNOSIS — R0902 Hypoxemia: Secondary | ICD-10-CM | POA: Diagnosis present

## 2018-02-11 DIAGNOSIS — G43009 Migraine without aura, not intractable, without status migrainosus: Secondary | ICD-10-CM | POA: Diagnosis present

## 2018-02-11 DIAGNOSIS — J9601 Acute respiratory failure with hypoxia: Secondary | ICD-10-CM | POA: Insufficient documentation

## 2018-02-11 DIAGNOSIS — Z791 Long term (current) use of non-steroidal anti-inflammatories (NSAID): Secondary | ICD-10-CM

## 2018-02-11 HISTORY — DX: Unspecified asthma, uncomplicated: J45.909

## 2018-02-11 LAB — CBC WITH DIFFERENTIAL/PLATELET
Abs Immature Granulocytes: 0.02 10*3/uL (ref 0.00–0.07)
BASOS PCT: 1 %
Basophils Absolute: 0.1 10*3/uL (ref 0.0–0.1)
EOS ABS: 0.2 10*3/uL (ref 0.0–0.5)
EOS PCT: 4 %
HCT: 42 % (ref 39.0–52.0)
HEMOGLOBIN: 13.2 g/dL (ref 13.0–17.0)
Immature Granulocytes: 0 %
LYMPHS PCT: 24 %
Lymphs Abs: 1.2 10*3/uL (ref 0.7–4.0)
MCH: 26.8 pg (ref 26.0–34.0)
MCHC: 31.4 g/dL (ref 30.0–36.0)
MCV: 85.4 fL (ref 80.0–100.0)
MONO ABS: 0.8 10*3/uL (ref 0.1–1.0)
Monocytes Relative: 16 %
Neutro Abs: 2.8 10*3/uL (ref 1.7–7.7)
Neutrophils Relative %: 55 %
Platelets: 283 10*3/uL (ref 150–400)
RBC: 4.92 MIL/uL (ref 4.22–5.81)
RDW: 12.6 % (ref 11.5–15.5)
WBC: 5 10*3/uL (ref 4.0–10.5)
nRBC: 0 % (ref 0.0–0.2)

## 2018-02-11 LAB — BASIC METABOLIC PANEL
Anion gap: 12 (ref 5–15)
BUN: 11 mg/dL (ref 6–20)
CALCIUM: 9.6 mg/dL (ref 8.9–10.3)
CO2: 26 mmol/L (ref 22–32)
CREATININE: 1.09 mg/dL (ref 0.61–1.24)
Chloride: 99 mmol/L (ref 98–111)
GFR calc Af Amer: >60 mL/min (ref >60)
Glucose, Bld: 94 mg/dL (ref 70–99)
Potassium: 3.8 mmol/L (ref 3.5–5.1)
SODIUM: 137 mmol/L (ref 135–145)

## 2018-02-11 LAB — D-DIMER, QUANTITATIVE (NOT AT ARMC): D DIMER QUANT: 0.53 ug{FEU}/mL — AB (ref 0.00–0.50)

## 2018-02-11 LAB — LACTATE DEHYDROGENASE: LDH: 162 U/L (ref 98–192)

## 2018-02-11 LAB — TROPONIN I: Troponin I: <0.03 ng/mL (ref <0.03)

## 2018-02-11 LAB — I-STAT TROPONIN, ED: Troponin i, poc: 0 ng/mL (ref 0.00–0.08)

## 2018-02-11 MED ORDER — ACETAMINOPHEN 650 MG RE SUPP: 650.0000 mg | Freq: Four times a day (QID) | RECTAL | Status: DC | PRN

## 2018-02-11 MED ORDER — ONDANSETRON HCL 4 MG PO TABS: 4.0000 mg | ORAL_TABLET | Freq: Four times a day (QID) | ORAL | Status: DC | PRN

## 2018-02-11 MED ORDER — ACETAMINOPHEN 325 MG PO TABS
650.0000 mg | ORAL_TABLET | Freq: Four times a day (QID) | ORAL | Status: DC | PRN
  Administered 2018-02-14 (×3): 650 mg via ORAL
  Filled 2018-02-11 (×3): qty 2

## 2018-02-11 MED ORDER — KETOROLAC TROMETHAMINE 30 MG/ML IJ SOLN
30.0000 mg | Freq: Once | INTRAMUSCULAR | Status: AC
  Administered 2018-02-11: 30 mg via INTRAVENOUS
  Filled 2018-02-11: qty 1

## 2018-02-11 MED ORDER — IOPAMIDOL (ISOVUE-370) INJECTION 76%
INTRAVENOUS | Status: AC
  Filled 2018-02-11: qty 100

## 2018-02-11 MED ORDER — ONDANSETRON HCL 4 MG/2ML IJ SOLN: 4.0000 mg | Freq: Four times a day (QID) | INTRAMUSCULAR | Status: DC | PRN

## 2018-02-11 MED ORDER — ALBUTEROL SULFATE (2.5 MG/3ML) 0.083% IN NEBU: 2.5000 mg | INHALATION_SOLUTION | Freq: Four times a day (QID) | RESPIRATORY_TRACT | Status: DC | PRN

## 2018-02-11 MED ORDER — IOPAMIDOL (ISOVUE-370) INJECTION 76%
100.0000 mL | Freq: Once | INTRAVENOUS | Status: AC | PRN
  Administered 2018-02-11: 100 mL via INTRAVENOUS

## 2018-02-11 NOTE — ED Notes (Signed)
Pt back from X-ray.  

## 2018-02-11 NOTE — ED Provider Notes (Signed)
Ultrasound ED Peripheral IV (Provider) Date/Time: 02/11/2018 6:37 PM Performed by: Pattricia Boss, MD Authorized by: Pattricia Boss, MD   Procedure details:    Indications: multiple failed IV attempts     Skin Prep: chlorhexidine gluconate     Location:  Right AC   Angiocath:  18 G   Bedside Ultrasound Guided: Yes     Images: not archived     Patient tolerated procedure without complications: Yes     Dressing applied: Yes        Pattricia Boss, MD 02/11/18 [phone]

## 2018-02-11 NOTE — ED Triage Notes (Signed)
Onset 1 week chest pressure pt states "dull, throbbing pain, feels like someone standing on center chest".  Pt reports he has hard time catching breath with exertion.  Talking in complete sentences, NAD.  Pt has been using Albuterol inhaler with no relief. No recent travel, surgeries, or hospitalization.  Pt takes Excedrin regularly at home for migraine headaches.

## 2018-02-11 NOTE — ED Provider Notes (Signed)
[facility] Provider Note   CSN: [id number] Arrival date & time: 02/11/18  1629     History   Chief Complaint Chief Complaint  Patient presents with  . Chest Pain    HPI Jeremiah Mason is a 33 y.o. male with PMH/o asthma, back pain, insomnia, migraine who presents for evaluation of 1 week of constant chest pain.  He states that the pain has been constantly there for the last week but he will have a few episodes where he feels like the pain becomes worse.  He states that these episodes have become more frequent and more severe over the last several days.  He describes it as a "dull, throbbing pain and feeling like someone is standing in the center of his chest."  Patient also notes that he has been having some shortness of breath that started about 4 days ago.  Patient states that he noticed that when he was walking up the stairs to his apartment, he would have to stop halfway between to catch his breath.  Patient states that this is abnormal for him.  He states that he has been taking Excedrin for last week but does not have any improvement in his chest pain.  He states that the pain is worse if he lays down or if he tries to move around.  He also reports it is worse when he coughs and has worsening pain with deep inspiration.  He states that sometimes it is worse with exertion.  He has not had any associated nausea, diaphoresis, vomiting associated with the chest pain.  He denies any preceding trauma, injury, fall.  He does report that he lifts about 40-50 pounds a day at work.  Patient does report that as a kid, he had a past medical history of asthma.  He states that he used an albuterol inhaler with the shortness of breath and states that he did not have any relief.  Patient states that he does not smoke, vape.  He denies any IV drug use, cocaine use. He denies any exogenous hormone use, recent immobilization, prior history of DVT/PE, recent surgery, leg  swelling, or long travel.  Patient states he has not had any recent sicknesses he denies any fevers, chills, cough, congestion.  Patient denies any abdominal pain, nausea/vomiting.  The history is provided by the patient.    Past Medical History:  Diagnosis Date  . Asthma   . 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 Junius Roads): helped but too expensive   Meds: Percocet/Vicodin (helped), OTC (ibuprofen)   . COMMON MIGRAINE 04/21/2009   Qualifier: Diagnosis of  By: Jeannine Kitten MD, Rodman Key    . INSOMNIA 09/14/2007   Qualifier: Diagnosis of  By: Zebedee Iba NP, Manuela Schwartz    . S/P ACL repair 2016   R knee    Patient Active Problem List   Diagnosis Date Noted  . Acute respiratory failure with hypoxia ([place]) 02/11/2018  . Exertional asthma 02/11/2018  . Exertional dyspnea 02/11/2018  . Exercise-induced asthma 10/09/2016  . Change in skin mole 10/09/2016  . S/P ACL repair 07/15/2014  . Elevated BP 03/14/2014  . Preventative health care 11/02/2011  . Migraine without aura 04/21/2009  . BACK PAIN 01/30/2008  . INSOMNIA 09/14/2007    Past Surgical History:  Procedure Laterality Date  .  ANTERIOR CRUCIATE LIGAMENT REPAIR Right 2016  . ORIF FINGER FRACTURE Right 2011   5th finger. broken in 3 places-4 pins put in        Home Medications    Prior to Admission medications   Medication Sig Start Date End Date Taking? Authorizing Provider  albuterol (PROVENTIL HFA;VENTOLIN HFA) 108 (90 Base) MCG/ACT inhaler Inhale 2 puffs into the lungs every 6 (six) hours as needed for wheezing. 10/07/16 04/12/18 Yes Bufford Lope, DO  aspirin-acetaminophen-caffeine (EXCEDRIN MIGRAINE) [phone] MG tablet Take 1 tablet by mouth 3 (three) times daily as needed for headache or migraine.   Yes [provider]  HYDROcodone-homatropine (HYDROMET) 5-1.5 MG/5ML syrup Take 5 mLs by mouth  every 6 (six) hours as needed for cough. Patient not taking: Reported on 02/11/2018 04/01/17   Robyn Haber, MD  ibuprofen (ADVIL,MOTRIN) 800 MG tablet Take 1 tablet (800 mg total) by mouth 3 (three) times daily. Patient not taking: Reported on 02/11/2018 03/26/17   Muthersbaugh, Jarrett Soho, PA-C  topiramate (TOPAMAX) 50 MG tablet Take 1 tablet (50 mg total) by mouth daily. Patient not taking: Reported on 02/11/2018 10/07/16   Bufford Lope, DO    Family History Family History  Problem Relation Age of Onset  . Diabetes Mother   . Hypertension Mother     Social History Social History   Tobacco Use  . Smoking status: Former Smoker    Types: Cigars, Cigarettes    Last attempt to quit: 09/27/2014    Years since quitting: 3.3  . Smokeless tobacco: Never Used  . Tobacco comment: 10 pack year history  Substance Use Topics  . Alcohol use: Yes    Comment: very rare  . Drug use: No     Allergies   Patient has no known allergies.   Review of Systems Review of Systems  Constitutional: Negative for fever.  Respiratory: Positive for shortness of breath. Negative for cough.   Cardiovascular: Positive for chest pain. Negative for leg swelling.  Gastrointestinal: Negative for abdominal pain, nausea and vomiting.  Genitourinary: Negative for dysuria and hematuria.  Neurological: Negative for headaches.  All other systems reviewed and are negative.    Physical Exam Updated Vital Signs BP (!) 137/96 (BP Location: Left Arm)   Pulse 73   Temp 98.2 F (36.8 C) (Oral)   Resp 16   SpO2 100%   Physical Exam Vitals signs and nursing note reviewed.  Constitutional:      Appearance: Normal appearance. He is well-developed.  HENT:     Head: Normocephalic and atraumatic.  Eyes:     General: Lids are normal.     Conjunctiva/sclera: Conjunctivae normal.     Pupils: Pupils are equal, round, and reactive to light.  Neck:     Musculoskeletal: Full passive range of motion without pain.    Cardiovascular:     Rate and Rhythm: Normal rate and regular rhythm.     Pulses: Normal pulses.          Radial pulses are 2+ on the right side and 2+ on the left side.       Dorsalis pedis pulses are 2+ on the right side and 2+ on the left side.     Heart sounds: Normal heart sounds. No murmur. No friction rub. No gallop.   Pulmonary:     Effort: Pulmonary effort is normal.     Breath sounds: Normal breath sounds. No wheezing.     Comments: Lungs clear to auscultation bilaterally.  Symmetric  chest rise.  No wheezing, rales, rhonchi. Able to speak in full sentences without difficulty.  Chest:     Chest wall: Tenderness present.     Comments: Mild tenderness palpation of the anterior chest wall at the midsternal area.  No deformity or crepitus noted. Abdominal:     Palpations: Abdomen is soft. Abdomen is not rigid.     Tenderness: There is no abdominal tenderness. There is no guarding.     Comments: Abdomen is soft, non-distended, non-tender. No rigidity, No guarding. No peritoneal signs.  Musculoskeletal: Normal range of motion.     Comments: BLE are symmetric in appearance. No overlying warmth, erythema, or edema  Skin:    General: Skin is warm and dry.     Capillary Refill: Capillary refill takes less than 2 seconds.  Neurological:     Mental Status: He is alert and oriented to person, place, and time.  Psychiatric:        Speech: Speech normal.      ED Treatments / Results  Labs (all labs ordered are listed, but only abnormal results are displayed) Labs Reviewed  D-DIMER, QUANTITATIVE (NOT AT [facility]) - Abnormal; Notable for the following components:      Result Value   D-Dimer, Quant 0.53 (*)    All other components within normal limits  BASIC METABOLIC PANEL  CBC WITH DIFFERENTIAL/PLATELET  HIV ANTIBODY (ROUTINE TESTING W REFLEX)  BASIC METABOLIC PANEL  CBC  TROPONIN I  TROPONIN I  TROPONIN I  LACTATE DEHYDROGENASE  I-STAT TROPONIN, ED     EKG None  Radiology Dg Chest 2 View  Result Date: 02/11/2018 CLINICAL DATA:  Chest pain and shortness of breath EXAM: CHEST - 2 VIEW COMPARISON:  04/01/2017 FINDINGS: Cardiac shadow is within normal limits. Lungs are well aerated bilaterally. No focal infiltrate or sizable effusion is seen. No acute bony abnormality is noted. IMPRESSION: No active cardiopulmonary disease. Electronically Signed   By: Inez Catalina M.D.   On: 02/11/2018 17:14   Ct Angio Chest Pe W And/or Wo Contrast  Result Date: 02/11/2018 CLINICAL DATA:  Shortness of breath, chest pressure EXAM: CT ANGIOGRAPHY CHEST WITH CONTRAST TECHNIQUE: Multidetector CT imaging of the chest was performed using the standard protocol during bolus administration of intravenous contrast. Multiplanar CT image reconstructions and MIPs were obtained to evaluate the vascular anatomy. CONTRAST:  158mL ISOVUE-370 IOPAMIDOL (ISOVUE-370) INJECTION 76% COMPARISON:  Chest x-ray 02/11/2018, 04/01/2017 FINDINGS: Cardiovascular: Satisfactory opacification of the pulmonary arteries to the segmental level. No evidence of pulmonary embolism. Normal heart size. No pericardial effusion. Nonaneurysmal aorta. Mediastinum/Nodes: Midline trachea. No thyroid mass. Esophagus normal. Confluent adenopathy or soft tissue thickening at the mainstem bronchi and subcarinal regions bilaterally. This is confluent with peribronchovascular soft tissue thickening. Enlarged bilateral hilar lymph nodes. Lungs/Pleura: Nodular consolidation at the left base measuring 2.1 cm, series 7, image number 64. Nodular consolidation within the apical segment of the right lower lobe measuring 17 mm, series 7, image number 38. Multiple additional small subpleural and perihilar nodules. No pleural effusion. Upper Abdomen: No acute abnormality. Musculoskeletal: No chest wall abnormality. No acute or significant osseous findings. Review of the MIP images confirms the above findings. IMPRESSION: 1.  Negative for acute pulmonary embolus. 2. Confluent adenopathy and or soft tissue thickening at the bilateral mainstem bronchi and subcarinal regions with bilateral hilar adenopathy noted. Peribronchial soft tissue thickening with multifocal masslike consolidations within the apical right lower lobe and the left lung base as described above. Additional small perihilar and  subpleural nodules. Differential considerations include multifocal infection with reactive adenopathy, lymphoproliferative disease/lymphoma, and granulomatous disease such as sarcoidosis. Pulmonary consultation is suggested. Electronically Signed   By: Donavan Foil M.D.   On: 02/11/2018 19:39    Procedures Procedures (including critical care time)  Medications Ordered in ED Medications  albuterol (PROVENTIL) (2.5 MG/3ML) 0.083% nebulizer solution 2.5 mg (has no administration in time range)  acetaminophen (TYLENOL) tablet 650 mg (has no administration in time range)    Or  acetaminophen (TYLENOL) suppository 650 mg (has no administration in time range)  ondansetron (ZOFRAN) tablet 4 mg (has no administration in time range)    Or  ondansetron (ZOFRAN) injection 4 mg (has no administration in time range)  ketorolac (TORADOL) 30 MG/ML injection 30 mg (30 mg Intravenous Given 02/11/18 1838)  iopamidol (ISOVUE-370) 76 % injection 100 mL (100 mLs Intravenous Contrast Given 02/11/18 1916)     Initial Impression / Assessment and Plan / ED Course  I have reviewed the triage vital signs and the nursing notes.  Pertinent labs & imaging results that were available during my care of the patient were reviewed by me and considered in my medical decision making (see chart for details).     33 year old male who presents for evaluation of chest pain and shortness of breath x1 week.  Shortness of breath worse with exertion.  Does report history of heavy lifting. Patient is afebrile, non-toxic appearing, sitting comfortably on examination  table. Vital signs reviewed and stable.  On exam, lungs clear to auscultation with no obvious wheezing.  Able speak in full sentences without any difficulty.  Concern for infectious etiology versus musculoskeletal pain.  Low suspicion for ACS etiology given atypical presentation but also consideration.  Also consider PE.  Patient is low risk and has no risk factors but he is having pleuritic pain and shortness of breath worse with exertion.  History/physical exam concerning for aortic dissection.  We will plan to check labs, chest x-ray, EKG.  Chest x-ray negative for any acute infectious etiology.  PERC negative.  BMP unremarkable.  CBC without any significant leukocytosis or anemia. D-Dimer positive at 0.53. Will proceed with CTA chest for evaluation of PE.   CTA shows: Confluent adenopathy and or soft tissue thickening at the bilateral mainstem bronchi and subcarinal regions with bilateral hilar adenopathy noted. Peribronchial soft tissue thickening with multifocal masslike consolidations within the apical right lower lobe and the left lung base as described above. Additional small perihilar and subpleural nodules. Differential considerations include multifocal infection with reactive adenopathy, lymphoproliferative disease/lymphoma, and granulomatous disease such as sarcoidosis.   Discussed CTA findings and patient with Dr. Oletta Darter ([place]) who reviewed patient's imaging. Imaging concerning for lymphoma vs lung cancer. Given findings, feels that medical admission with plans for pulmonary consult in the morning for work up is indicated. He will plan to have pulm consult on patient tomorrow.   Discussed patient with Dr. Hal Hope (hospitalist). Will accept patient for admission.    Final Clinical Impressions(s) / ED Diagnoses   Final diagnoses:  Shortness of breath  Pulmonary nodules    ED Discharge Orders    None       Desma Mcgregor 02/11/18 2237    Pattricia Boss,  MD 02/15/18 [phone]

## 2018-02-11 NOTE — H&P (Signed)
History and Physical    Jeremiah Mason PFX:[id number] DOB: 08/16/1985 DOA: 02/11/2018  PCP: Default, Provider, MD  Patient coming from: Home.  Chief Complaint: Shortness of breath chest tightness.  HPI: BUEFORD ARP is a 33 y.o. male with history of migraine for which patient takes Excedrin every month presents to the ER with complaints of chest tightness and shortness of breath which has been ongoing for last 1 week.  Shortness of breath increased on exertion and also on lying flat.  Denies any productive cough fever chills night sweats weight loss recent travel or sick contacts.  With ongoing symptoms patient came to the ER.  ED Course: In the ER patient is mildly dyspneic on walking otherwise not hypoxic.  Patient was afebrile.  D-dimer was mildly elevated for which CT angiogram of the chest was done which shows bilateral hilar adenopathy and multifocal consolidation and nodules.  ER physician discussed with on-call pulmonary critical care physician who advised admission for observation and further work-up for which pulmonary will be consulting.  Review of Systems: As per HPI, rest all negative.   Past Medical History:  Diagnosis Date  . Asthma   . 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 Junius Roads): helped but too expensive   Meds: Percocet/Vicodin (helped), OTC (ibuprofen)   . COMMON MIGRAINE 04/21/2009   Qualifier: Diagnosis of  By: Jeannine Kitten MD, Rodman Key    . INSOMNIA 09/14/2007   Qualifier: Diagnosis of  By: Zebedee Iba NP, Manuela Schwartz    . S/P ACL repair 2016   R knee    Past Surgical History:  Procedure Laterality Date  . ANTERIOR CRUCIATE LIGAMENT REPAIR Right 2016  . ORIF FINGER FRACTURE Right 2011   5th finger. broken in 3 places-4 pins put in     reports that he quit smoking about 3 years ago. His smoking use included cigars and cigarettes. He  has never used smokeless tobacco. He reports current alcohol use. He reports that he does not use drugs.  No Known Allergies  Family History  Problem Relation Age of Onset  . Diabetes Mother   . Hypertension Mother     Prior to Admission medications   Medication Sig Start Date End Date Taking? Authorizing Provider  albuterol (PROVENTIL HFA;VENTOLIN HFA) 108 (90 Base) MCG/ACT inhaler Inhale 2 puffs into the lungs every 6 (six) hours as needed for wheezing. 10/07/16 04/12/18 Yes Bufford Lope, DO  aspirin-acetaminophen-caffeine (EXCEDRIN MIGRAINE) [phone] MG tablet Take 1 tablet by mouth 3 (three) times daily as needed for headache or migraine.   Yes [provider]  HYDROcodone-homatropine (HYDROMET) 5-1.5 MG/5ML syrup Take 5 mLs by mouth every 6 (six) hours as needed for cough. Patient not taking: Reported on 02/11/2018 04/01/17   Robyn Haber, MD  ibuprofen (ADVIL,MOTRIN) 800 MG tablet Take 1 tablet (800 mg total) by mouth 3 (three) times daily. Patient not taking: Reported on 02/11/2018 03/26/17   Muthersbaugh, Jarrett Soho, PA-C  topiramate (TOPAMAX) 50 MG tablet Take 1 tablet (50 mg total) by mouth daily. Patient not taking: Reported on 02/11/2018 10/07/16   Bufford Lope, DO    Physical Exam: Vitals:   02/11/18 1639  BP: (!) 141/102  Pulse: 94  Resp: 18  Temp: 98.2 F (36.8 C)  TempSrc: Oral  SpO2: 99%      Constitutional:  Moderately built and nourished. Vitals:   02/11/18 1639  BP: (!) 141/102  Pulse: 94  Resp: 18  Temp: 98.2 F (36.8 C)  TempSrc: Oral  SpO2: 99%   Eyes: Nonicteric no pallor. ENMT: No discharge from the ears eyes nose or mouth. Neck: No mass felt.  No neck rigidity. Respiratory: No rhonchi or crepitations. Cardiovascular: S1-S2 heard. Abdomen: Soft nontender bowel sounds present. Musculoskeletal: No edema.  No joint effusion. Skin: No rash. Neurologic: Alert awake oriented to time place and person.  Moves all extremities. Psychiatric:  Appears normal.  Normal affect.   Labs on Admission: I have personally reviewed following labs and imaging studies  CBC: Recent Labs  Lab 02/11/18 1712  WBC 5.0  NEUTROABS 2.8  HGB 13.2  HCT 42.0  MCV 85.4  PLT 149   Basic Metabolic Panel: Recent Labs  Lab 02/11/18 1712  NA 137  K 3.8  CL 99  CO2 26  GLUCOSE 94  BUN 11  CREATININE 1.09  CALCIUM 9.6   GFR: CrCl cannot be calculated (Unknown ideal weight.). Liver Function Tests: No results for input(s): AST, ALT, ALKPHOS, BILITOT, PROT, ALBUMIN in the last 168 hours. No results for input(s): LIPASE, AMYLASE in the last 168 hours. No results for input(s): AMMONIA in the last 168 hours. Coagulation Profile: No results for input(s): INR, PROTIME in the last 168 hours. Cardiac Enzymes: No results for input(s): CKTOTAL, CKMB, CKMBINDEX, TROPONINI in the last 168 hours. BNP (last 3 results) No results for input(s): PROBNP in the last 8760 hours. HbA1C: No results for input(s): HGBA1C in the last 72 hours. CBG: No results for input(s): GLUCAP in the last 168 hours. Lipid Profile: No results for input(s): CHOL, HDL, LDLCALC, TRIG, CHOLHDL, LDLDIRECT in the last 72 hours. Thyroid Function Tests: No results for input(s): TSH, T4TOTAL, FREET4, T3FREE, THYROIDAB in the last 72 hours. Anemia Panel: No results for input(s): VITAMINB12, FOLATE, FERRITIN, TIBC, IRON, RETICCTPCT in the last 72 hours. Urine analysis:    Component Value Date/Time   BILIRUBINUR NEG 11/25/2010 0952   PROTEINUR NEG 11/25/2010 0952   UROBILINOGEN 1.0 11/25/2010 0952   NITRITE NEG 11/25/2010 0952   LEUKOCYTESUR NEG 11/25/2010 0952   Sepsis Labs: @LABRCNTIP (procalcitonin:4,lacticidven:4) )No results found for this or any previous visit (from the past 240 hour(s)).   Radiological Exams on Admission: Dg Chest 2 View  Result Date: 02/11/2018 CLINICAL DATA:  Chest pain and shortness of breath EXAM: CHEST - 2 VIEW COMPARISON:  04/01/2017 FINDINGS:  Cardiac shadow is within normal limits. Lungs are well aerated bilaterally. No focal infiltrate or sizable effusion is seen. No acute bony abnormality is noted. IMPRESSION: No active cardiopulmonary disease. Electronically Signed   By: Inez Catalina M.D.   On: 02/11/2018 17:14   Ct Angio Chest Pe W And/or Wo Contrast  Result Date: 02/11/2018 CLINICAL DATA:  Shortness of breath, chest pressure EXAM: CT ANGIOGRAPHY CHEST WITH CONTRAST TECHNIQUE: Multidetector CT imaging of the chest was performed using the standard protocol during bolus administration of intravenous contrast. Multiplanar CT image reconstructions and MIPs were obtained to evaluate the vascular anatomy. CONTRAST:  160mL ISOVUE-370 IOPAMIDOL (ISOVUE-370) INJECTION 76% COMPARISON:  Chest x-ray 02/11/2018, 04/01/2017 FINDINGS: Cardiovascular: Satisfactory opacification of the pulmonary arteries to the segmental level. No evidence of pulmonary embolism. Normal heart size. No pericardial effusion. Nonaneurysmal aorta. Mediastinum/Nodes: Midline trachea. No thyroid mass. Esophagus normal. Confluent adenopathy or soft tissue thickening at the mainstem bronchi and subcarinal regions bilaterally. This is confluent with peribronchovascular soft tissue  thickening. Enlarged bilateral hilar lymph nodes. Lungs/Pleura: Nodular consolidation at the left base measuring 2.1 cm, series 7, image number 64. Nodular consolidation within the apical segment of the right lower lobe measuring 17 mm, series 7, image number 38. Multiple additional small subpleural and perihilar nodules. No pleural effusion. Upper Abdomen: No acute abnormality. Musculoskeletal: No chest wall abnormality. No acute or significant osseous findings. Review of the MIP images confirms the above findings. IMPRESSION: 1. Negative for acute pulmonary embolus. 2. Confluent adenopathy and or soft tissue thickening at the bilateral mainstem bronchi and subcarinal regions with bilateral hilar adenopathy  noted. Peribronchial soft tissue thickening with multifocal masslike consolidations within the apical right lower lobe and the left lung base as described above. Additional small perihilar and subpleural nodules. Differential considerations include multifocal infection with reactive adenopathy, lymphoproliferative disease/lymphoma, and granulomatous disease such as sarcoidosis. Pulmonary consultation is suggested. Electronically Signed   By: Donavan Foil M.D.   On: 02/11/2018 19:39    EKG: Independently reviewed.  Normal sinus rhythm.  Assessment/Plan Active Problems:   Migraine without aura   Acute respiratory failure with hypoxia ([facility])   Exertional asthma   Exertional dyspnea    #1.  Exertional shortness of breath and chest tightness with concerning CAT scan findings showing multifocal consolidation nodules and bilateral hilar lymphadenopathy -differentials include as per the CAT scan reading per radiologist - Differential considerations include multifocal infection with reactive adenopathy, lymphoproliferative disease/lymphoma, and granulomatous disease such as sarcoidosis.  Pulmonary critical care has been consulted.  Since patient complains of chest tightness we will cycle cardiac markers.  Check LDH check blood cultures sed rate and further recommendations per pulmonologist. #2.  History of migraine headaches presently headache free. #3.  Elevated blood pressure reading closely follow blood pressure trends.   DVT prophylaxis: SCDs in anticipation of possible procedures. Code Status: Full code. Family Communication: Patient's mother. Disposition Plan: Home. Consults called: Pulmonary critical care. Admission status: Observation.   Rise Patience MD [facility] Pager [phone].  If 7PM-7AM, please contact night-coverage [email or web address] Password [facility]  02/11/2018, 9:32 PM

## 2018-02-11 NOTE — ED Notes (Signed)
Patient transported to X-ray 

## 2018-02-12 ENCOUNTER — Other Ambulatory Visit: Payer: Self-pay

## 2018-02-12 DIAGNOSIS — J181 Lobar pneumonia, unspecified organism: Secondary | ICD-10-CM

## 2018-02-12 DIAGNOSIS — R590 Localized enlarged lymph nodes: Secondary | ICD-10-CM | POA: Diagnosis present

## 2018-02-12 DIAGNOSIS — R9389 Abnormal findings on diagnostic imaging of other specified body structures: Secondary | ICD-10-CM | POA: Diagnosis present

## 2018-02-12 LAB — BASIC METABOLIC PANEL
Anion gap: 11 (ref 5–15)
BUN: 14 mg/dL (ref 6–20)
CO2: 26 mmol/L (ref 22–32)
Calcium: 9.4 mg/dL (ref 8.9–10.3)
Chloride: 99 mmol/L (ref 98–111)
Creatinine, Ser: 1.17 mg/dL (ref 0.61–1.24)
GFR calc Af Amer: >60 mL/min (ref >60)
GFR calc non Af Amer: >60 mL/min (ref >60)
Glucose, Bld: 118 mg/dL — ABNORMAL HIGH (ref 70–99)
POTASSIUM: 3.7 mmol/L (ref 3.5–5.1)
Sodium: 136 mmol/L (ref 135–145)

## 2018-02-12 LAB — TROPONIN I
Troponin I: <0.03 ng/mL (ref <0.03)
Troponin I: <0.03 ng/mL (ref <0.03)

## 2018-02-12 LAB — PROCALCITONIN: Procalcitonin: <0.1 ng/mL

## 2018-02-12 LAB — PROTIME-INR
INR: 1.02
Prothrombin Time: 13.3 seconds (ref 11.4–15.2)

## 2018-02-12 LAB — CBC
HCT: 38.3 % — ABNORMAL LOW (ref 39.0–52.0)
Hemoglobin: 12.2 g/dL — ABNORMAL LOW (ref 13.0–17.0)
MCH: 27.1 pg (ref 26.0–34.0)
MCHC: 31.9 g/dL (ref 30.0–36.0)
MCV: 84.9 fL (ref 80.0–100.0)
NRBC: 0 % (ref 0.0–0.2)
Platelets: 264 10*3/uL (ref 150–400)
RBC: 4.51 MIL/uL (ref 4.22–5.81)
RDW: 12.5 % (ref 11.5–15.5)
WBC: 4.5 10*3/uL (ref 4.0–10.5)

## 2018-02-12 LAB — APTT: aPTT: 36 seconds (ref 24–36)

## 2018-02-12 LAB — HIV ANTIBODY (ROUTINE TESTING W REFLEX): HIV Screen 4th Generation wRfx: NONREACTIVE

## 2018-02-12 LAB — SEDIMENTATION RATE: Sed Rate: 27 mm/hr — ABNORMAL HIGH (ref 0–16)

## 2018-02-12 MED ORDER — MUPIROCIN 2 % EX OINT
1.0000 "application " | TOPICAL_OINTMENT | Freq: Two times a day (BID) | CUTANEOUS | Status: DC
  Administered 2018-02-13 – 2018-02-14 (×4): 1 via NASAL
  Filled 2018-02-12: qty 22

## 2018-02-12 MED ORDER — KETOROLAC TROMETHAMINE 30 MG/ML IJ SOLN
30.0000 mg | Freq: Four times a day (QID) | INTRAMUSCULAR | Status: AC | PRN
  Administered 2018-02-12 (×2): 30 mg via INTRAVENOUS
  Filled 2018-02-12 (×2): qty 1

## 2018-02-12 NOTE — Consult Note (Signed)
NAME:  Jeremiah Mason, MRN:  [id number], DOB:  Jul 28, 1985, LOS: 0 ADMISSION DATE:  02/11/2018, CONSULTATION DATE:  02/12/18 REFERRING MD:  Dr. Hal Hope, CHIEF COMPLAINT:  SOB   Brief History   33 y/o M admitted 1/26 with reports of shortness of breath, chest pressure.    History of present illness   33 y/o M who presented to [facility] on 1/26 with a two week history of dry cough and shortness of breath.    Patient reported he began having a dry cough and associated SOB episodes.  SOB was at rest and with exertion.  Denies fever, chills, sputum production, rashes, dry eyes, weight loss.  Reports one isolated dark skin lesion on the right side of his chest and blurred vision.  Reports family history of sarcoidosis (maternal aunt).  States he works at [employer] and a [employer]  Has worked in multiple jobs with dust, metal, chemical exposures.  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    Admitted per [facility] for further evaluation.  CTA Chest assessed on admit and negative for PE. However, did show lymphadenopathy, peribronchial soft tissue thickening and multifocal mass like consolidations, nodules.    PCCM consulted for evaluation of SOB, abnormal CT.    Past Medical History  Asthma - childhood Vaping - "vapes" more than he smoked, uses 37m dose of nicotine / liquid Tobacco Abuse - quit 2016, smoked since age 33 1/2 ppd  [facility]  [facility]   Consults:  PCCM 1/27   Procedures:     Significant Diagnostic Tests:  CTA Chest 1/26 >> neg for PE, adenopathy, peribronchial soft tissue thickening, multifocal mass like consolidations, small perihilar and subpleural nodules UDS 1/27 >>  ACE 1/27 >> ESR 1/27 >>    Micro Data:  BCx2 1/26 >>  HIV 1/26 >>   Antimicrobials:     Interim history/subjective:  As above  Objective   Blood pressure (!) 121/94, pulse 66, temperature (!) 97 F (36.1 C), temperature source Axillary, resp. rate 18, SpO2 99 %.       No  intake or output data in the 24 hours ending 02/12/18 1128 There were no vitals filed for this visit.  Examination: General: young adult male lying in bed in NAD HEENT: MM pink/moist, on RA, good dentition  Neuro: AAOx4, speech clear, MAE, non-focal exam  CV: s1s2 rrr, no m/r/g PULM: even/non-labored, lungs bilaterally clear anterior, few bibasilar posterior crackles on inspiration  GI: soft, non-tender, bsx4 active  Extremities: warm/dry, no LE edema  Skin: one isolated circular dark pigmented lesion on right chest wall  Resolved Hospital Problem list      Assessment & Plan:   Bilateral Nodular Opacities  Lymphadenopathy  -ddx includes infectious/viral, sarcoidosis, vaping / exposures and malignancy  P: Assess ACE, Sed Rate Assess UDS May need FOB vs EBUS for biopsy to rule out infection, malignancy and assess for sarcoidosis  Hold abx / steroids for now  If sarcoid, will need opthalmologic follow up    Vaping Use  P: Cessation counseling  Assess UDS   Best practice:  Diet: As tolerated  Pain/Anxiety/Delirium protocol (if indicated): n/a VAP protocol (if indicated): n/a DVT prophylaxis: per primary  GI prophylaxis: n/a Glucose control: n/a Mobility: as tolerated  Code Status: full code  Family Communication: Patient, mother, father, aunt, brother updated on plan of care  Disposition: Per [facility]   Labs   CBC: Recent Labs  Lab 02/11/18 1712 02/12/18 00272  WBC 5.0 4.5  NEUTROABS 2.8  --   HGB 13.2 12.2*  HCT 42.0 38.3*  MCV 85.4 84.9  PLT 283 373    Basic Metabolic Panel: Recent Labs  Lab 02/11/18 1712 02/12/18 0324  NA 137 136  K 3.8 3.7  CL 99 99  CO2 26 26  GLUCOSE 94 118*  BUN 11 14  CREATININE 1.09 1.17  CALCIUM 9.6 9.4   GFR: CrCl cannot be calculated (Unknown ideal weight.). Recent Labs  Lab 02/11/18 1712 02/12/18 0324 02/12/18 0649  PROCALCITON  --   --  <0.10  WBC 5.0 4.5  --     Liver Function Tests: No results for input(s):  AST, ALT, ALKPHOS, BILITOT, PROT, ALBUMIN in the last 168 hours. No results for input(s): LIPASE, AMYLASE in the last 168 hours. No results for input(s): AMMONIA in the last 168 hours.  ABG No results found for: PHART, PCO2ART, PO2ART, HCO3, TCO2, ACIDBASEDEF, O2SAT   Coagulation Profile: No results for input(s): INR, PROTIME in the last 168 hours.  Cardiac Enzymes: Recent Labs  Lab 02/11/18 2157 02/12/18 0324 02/12/18 0949  TROPONINI <0.03 <0.03 <0.03    HbA1C: No results found for: HGBA1C  CBG: No results for input(s): GLUCAP in the last 168 hours.  Review of Systems: Positives in [place]   Gen: Denies fever, chills, weight change, fatigue, night sweats HEENT: Denies blurred vision, double vision, hearing loss, tinnitus, sinus congestion, rhinorrhea, sore throat, neck stiffness, dysphagia PULM: Denies shortness of breath, dry, non-productive cough, sputum production, hemoptysis, wheezing CV: Denies chest pain, edema, orthopnea, paroxysmal nocturnal dyspnea, palpitations GI: Denies abdominal pain, nausea, vomiting, diarrhea, hematochezia, melena, constipation, change in bowel habits GU: Denies dysuria, hematuria, polyuria, oliguria, urethral discharge Endocrine: Denies hot or cold intolerance, polyuria, polyphagia or appetite change Derm: Denies rash, dry skin, scaling or peeling skin change Heme: Denies easy bruising, bleeding, bleeding gums Neuro: Denies headache, numbness, weakness, slurred speech, loss of memory or consciousness   Past Medical History  He,  has a past medical history of Asthma, BACK PAIN (01/30/2008), COMMON MIGRAINE (04/21/2009), INSOMNIA (09/14/2007), and S/P ACL repair (2016).   Surgical History    Past Surgical History:  Procedure Laterality Date  . ANTERIOR CRUCIATE LIGAMENT REPAIR Right 2016  . ORIF FINGER FRACTURE Right 2011   5th finger. broken in 3 places-4 pins put in     Social History   reports that he quit smoking about 3 years ago. His  smoking use included cigars and cigarettes. He has never used smokeless tobacco. He reports current alcohol use. He reports that he does not use drugs.   Family History   His family history includes Diabetes in his mother; Hypertension in his mother.   Allergies No Known Allergies   Home Medications  Prior to Admission medications   Medication Sig Start Date End Date Taking? Authorizing Provider  albuterol (PROVENTIL HFA;VENTOLIN HFA) 108 (90 Base) MCG/ACT inhaler Inhale 2 puffs into the lungs every 6 (six) hours as needed for wheezing. 10/07/16 04/12/18 Yes Bufford Lope, DO  aspirin-acetaminophen-caffeine (EXCEDRIN MIGRAINE) [phone] MG tablet Take 1 tablet by mouth 3 (three) times daily as needed for headache or migraine.   Yes [provider]  HYDROcodone-homatropine (HYDROMET) 5-1.5 MG/5ML syrup Take 5 mLs by mouth every 6 (six) hours as needed for cough. Patient not taking: Reported on 02/11/2018 04/01/17   Robyn Haber, MD  ibuprofen (ADVIL,MOTRIN) 800 MG tablet Take 1 tablet (800 mg total) by mouth 3 (three) times daily.  Patient not taking: Reported on 02/11/2018 03/26/17   Muthersbaugh, Jarrett Soho, PA-C  topiramate (TOPAMAX) 50 MG tablet Take 1 tablet (50 mg total) by mouth daily. Patient not taking: Reported on 02/11/2018 10/07/16   Bufford Lope, DO     Critical care time: n/a    Noe Gens, NP-C [facility] Pgr: [phone] or if no answer [phone] 02/12/2018, 11:28 AM

## 2018-02-12 NOTE — Progress Notes (Addendum)
[facility] PROGRESS NOTE  Jeremiah Mason:[id number] DOB: November 29, 1985 DOA: 02/11/2018 [facility]CP: [facility] and will pick up patient in am.  Assessment/Plan:  #1.  Exertional shortness of breath and chest tightness with concerning CAT scan findings showing multifocal consolidation nodules and bilateral hilar lymphadenopathy. Differentials include as per the CAT scan reading per radiologist multifocal infection with reactive adenopathy, lymphoproliferative disease/lymphoma, and granulomatous disease such as sarcoidosis.  Pulmonary critical care has been consulted. Troponin negative x2. Sed rate and blood cultures pending. ekg without acute changes. LDH 162. Oxygen saturation level greater than 90% on room air -follow blood cultures -monitor oxygen saturation level -await pulm recommendation  #2.  History of migraine headaches: remains  headache free. #3.  Elevated blood pressure. Denies diagnosis or meds -follow blood pressure trends.  Code Status: full Family Communication: none present Disposition Plan: to be determined   Consultants:  pulmonary  Procedures:    Antibiotics:    HPI/Subjective: 33 yo presented with doe and chest pain. Workup reveals abnormal chest CT concerning for lymphoma vs cancer. Pulmonology consulted  Lying flat in bed in no acute distress. Denies pain/discomfort. Denies sob  Objective: Vitals:   02/12/18 0003 02/12/18 0830  BP: 131/72 (!) 121/94  Pulse: 70 66  Resp: 18   Temp:  (!) 97 F (36.1 C)  SpO2: 100% 99%   No intake or output data in the 24 hours ending 02/12/18 0903 There were no vitals filed for this visit.  Exam:   General:  Lying flat in bed responds to verbal stimuli no acute distress  Cardiovascular: rrr no mgr no LE edema  Respiratory: normal effort BS clear but distant no wheeze no crackles  Abdomen: non-distended non-tender +BS no guarding or rebounding  Musculoskeletal: joints without  swelling/erythema   Data Reviewed: Basic Metabolic Panel: Recent Labs  Lab 02/11/18 1712 02/12/18 0324  NA 137 136  K 3.8 3.7  CL 99 99  CO2 26 26  GLUCOSE 94 118*  BUN 11 14  CREATININE 1.09 1.17  CALCIUM 9.6 9.4   Liver Function Tests: No results for input(s): AST, ALT, ALKPHOS, BILITOT, PROT, ALBUMIN in the last 168 hours. No results for input(s): LIPASE, AMYLASE in the last 168 hours. No results for input(s): AMMONIA in the last 168 hours. CBC: Recent Labs  Lab 02/11/18 1712 02/12/18 0324  WBC 5.0 4.5  NEUTROABS 2.8  --   HGB 13.2 12.2*  HCT 42.0 38.3*  MCV 85.4 84.9  PLT 283 264   Cardiac Enzymes: Recent Labs  Lab 02/11/18 2157 02/12/18 0324  TROPONINI <0.03 <0.03   BNP (last 3 results) No results for input(s): BNP in the last 8760 hours.  ProBNP (last 3 results) No results for input(s): PROBNP in the last 8760 hours.  CBG: No results for input(s): GLUCAP in the last 168 hours.  No results found for this or any previous visit (from the past 240 hour(s)).   Studies: Dg Chest 2 View  Result Date: 02/11/2018 CLINICAL DATA:  Chest pain and shortness of breath EXAM: CHEST - 2 VIEW COMPARISON:  04/01/2017 FINDINGS: Cardiac shadow is within normal limits. Lungs are well aerated bilaterally. No focal infiltrate or sizable effusion is seen. No acute bony abnormality is noted. IMPRESSION: No active cardiopulmonary disease. Electronically Signed   By: Inez Catalina M.D.   On: 02/11/2018 17:14   Ct Angio Chest Pe W And/or Wo Contrast  Result Date: 02/11/2018 CLINICAL DATA:  Shortness of breath, chest pressure  EXAM: CT ANGIOGRAPHY CHEST WITH CONTRAST TECHNIQUE: Multidetector CT imaging of the chest was performed using the standard protocol during bolus administration of intravenous contrast. Multiplanar CT image reconstructions and MIPs were obtained to evaluate the vascular anatomy. CONTRAST:  115mL ISOVUE-370 IOPAMIDOL (ISOVUE-370) INJECTION 76% COMPARISON:  Chest  x-ray 02/11/2018, 04/01/2017 FINDINGS: Cardiovascular: Satisfactory opacification of the pulmonary arteries to the segmental level. No evidence of pulmonary embolism. Normal heart size. No pericardial effusion. Nonaneurysmal aorta. Mediastinum/Nodes: Midline trachea. No thyroid mass. Esophagus normal. Confluent adenopathy or soft tissue thickening at the mainstem bronchi and subcarinal regions bilaterally. This is confluent with peribronchovascular soft tissue thickening. Enlarged bilateral hilar lymph nodes. Lungs/Pleura: Nodular consolidation at the left base measuring 2.1 cm, series 7, image number 64. Nodular consolidation within the apical segment of the right lower lobe measuring 17 mm, series 7, image number 38. Multiple additional small subpleural and perihilar nodules. No pleural effusion. Upper Abdomen: No acute abnormality. Musculoskeletal: No chest wall abnormality. No acute or significant osseous findings. Review of the MIP images confirms the above findings. IMPRESSION: 1. Negative for acute pulmonary embolus. 2. Confluent adenopathy and or soft tissue thickening at the bilateral mainstem bronchi and subcarinal regions with bilateral hilar adenopathy noted. Peribronchial soft tissue thickening with multifocal masslike consolidations within the apical right lower lobe and the left lung base as described above. Additional small perihilar and subpleural nodules. Differential considerations include multifocal infection with reactive adenopathy, lymphoproliferative disease/lymphoma, and granulomatous disease such as sarcoidosis. Pulmonary consultation is suggested. Electronically Signed   By: Donavan Foil M.D.   On: 02/11/2018 19:39    Scheduled Meds: Continuous Infusions:  Active Problems:   DOE (dyspnea on exertion)   Adenopathy, hilar   Multifocal lung consolidation (HCC)   Abnormal chest CT   Migraine without aura    Time spent: 45 minutes    [place] NP  [facility]   If 7PM-7AM, please contact night-coverage at [email or web address], password [facility] 02/12/2018, 9:03 AM  LOS: 0 days

## 2018-02-12 NOTE — Care Management Note (Signed)
Case Management Note  Patient Details  Name: BOYDE GRIECO MRN: [id number] Date of Birth: [birth year]  Subjective/Objective:     From home, acute resp failure with hypoxia, pta indep, he goes to [facility] and would like to continue ,will need generic meds at discharge , he goes to [facility] [address].                 Action/Plan: DC when ready.  Expected Discharge Date:                  Expected Discharge Plan:  Home/Self Care  In-House Referral:     Discharge planning Services  CM Consult  Post Acute Care Choice:    Choice offered to:     DME Arranged:    DME Agency:     HH Arranged:    HH Agency:     Status of Service:  In process, will continue to follow  If discussed at Long Length of Stay Meetings, dates discussed:    Additional Comments:  Zenon Mayo, RN 02/12/2018, 10:44 AM

## 2018-02-12 NOTE — ED Notes (Signed)
Patient denies pain and is resting comfortably.  

## 2018-02-13 ENCOUNTER — Inpatient Hospital Stay (HOSPITAL_COMMUNITY): Payer: Self-pay

## 2018-02-13 ENCOUNTER — Observation Stay (HOSPITAL_COMMUNITY): Payer: Self-pay | Admitting: Anesthesiology

## 2018-02-13 ENCOUNTER — Observation Stay (HOSPITAL_BASED_OUTPATIENT_CLINIC_OR_DEPARTMENT_OTHER): Payer: Self-pay

## 2018-02-13 ENCOUNTER — Encounter (HOSPITAL_COMMUNITY): Payer: Self-pay | Admitting: Orthopedic Surgery

## 2018-02-13 ENCOUNTER — Encounter (HOSPITAL_COMMUNITY)
Admission: EM | Disposition: A | Discharge status: Home / Self Care | Payer: Self-pay | Source: Home / Self Care | Attending: Family Medicine

## 2018-02-13 DIAGNOSIS — R0602 Shortness of breath: Secondary | ICD-10-CM

## 2018-02-13 DIAGNOSIS — J9601 Acute respiratory failure with hypoxia: Secondary | ICD-10-CM

## 2018-02-13 DIAGNOSIS — R0609 Other forms of dyspnea: Secondary | ICD-10-CM | POA: Diagnosis present

## 2018-02-13 HISTORY — PX: VIDEO BRONCHOSCOPY WITH ENDOBRONCHIAL ULTRASOUND: SHX6177

## 2018-02-13 LAB — ANGIOTENSIN CONVERTING ENZYME: Angiotensin-Converting Enzyme: 98 U/L — ABNORMAL HIGH (ref 14–82)

## 2018-02-13 LAB — ECHOCARDIOGRAM COMPLETE: Weight: 2708.8 oz

## 2018-02-13 LAB — SURGICAL PCR SCREEN
MRSA, PCR: NEGATIVE
Staphylococcus aureus: NEGATIVE

## 2018-02-13 SURGERY — BRONCHOSCOPY, WITH EBUS
Anesthesia: General | Site: Chest

## 2018-02-13 MED ORDER — GUAIFENESIN-DM 100-10 MG/5ML PO SYRP: 5.0000 mL | ORAL_SOLUTION | ORAL | Status: DC | PRN

## 2018-02-13 MED ORDER — FENTANYL CITRATE (PF) 100 MCG/2ML IJ SOLN
INTRAMUSCULAR | Status: AC
  Filled 2018-02-13: qty 2

## 2018-02-13 MED ORDER — FENTANYL CITRATE (PF) 250 MCG/5ML IJ SOLN
INTRAMUSCULAR | Status: AC
  Filled 2018-02-13: qty 5

## 2018-02-13 MED ORDER — DEXAMETHASONE SODIUM PHOSPHATE 10 MG/ML IJ SOLN
INTRAMUSCULAR | Status: DC | PRN
  Administered 2018-02-13: 10 mg via INTRAVENOUS

## 2018-02-13 MED ORDER — PROMETHAZINE HCL 25 MG/ML IJ SOLN: 6.2500 mg | INTRAMUSCULAR | Status: DC | PRN

## 2018-02-13 MED ORDER — LACTATED RINGERS IV SOLN
INTRAVENOUS | Status: DC | PRN
  Administered 2018-02-13: 12:00:00 via INTRAVENOUS

## 2018-02-13 MED ORDER — PROPOFOL 10 MG/ML IV BOLUS
INTRAVENOUS | Status: DC | PRN
  Administered 2018-02-13: 40 mg via INTRAVENOUS
  Administered 2018-02-13: 160 mg via INTRAVENOUS

## 2018-02-13 MED ORDER — MIDAZOLAM HCL 5 MG/5ML IJ SOLN
INTRAMUSCULAR | Status: DC | PRN
  Administered 2018-02-13: 2 mg via INTRAVENOUS

## 2018-02-13 MED ORDER — ACETAMINOPHEN 10 MG/ML IV SOLN: 1000.0000 mg | Freq: Once | INTRAVENOUS | Status: DC | PRN

## 2018-02-13 MED ORDER — ROCURONIUM BROMIDE 50 MG/5ML IV SOSY
PREFILLED_SYRINGE | INTRAVENOUS | Status: AC
  Filled 2018-02-13: qty 10

## 2018-02-13 MED ORDER — METHYLPREDNISOLONE SODIUM SUCC 125 MG IJ SOLR
125.0000 mg | Freq: Two times a day (BID) | INTRAMUSCULAR | Status: DC
  Administered 2018-02-13 – 2018-02-15 (×4): 125 mg via INTRAVENOUS
  Filled 2018-02-13 (×4): qty 2

## 2018-02-13 MED ORDER — PHENOL 1.4 % MT LIQD
1.0000 | OROMUCOSAL | Status: DC | PRN
  Administered 2018-02-13: 1 via OROMUCOSAL
  Filled 2018-02-13: qty 177

## 2018-02-13 MED ORDER — 0.9 % SODIUM CHLORIDE (POUR BTL) OPTIME
TOPICAL | Status: DC | PRN
  Administered 2018-02-13: 1000 mL

## 2018-02-13 MED ORDER — PHENYLEPHRINE 40 MCG/ML (10ML) SYRINGE FOR IV PUSH (FOR BLOOD PRESSURE SUPPORT)
PREFILLED_SYRINGE | INTRAVENOUS | Status: AC
  Filled 2018-02-13: qty 10

## 2018-02-13 MED ORDER — ONDANSETRON HCL 4 MG/2ML IJ SOLN
INTRAMUSCULAR | Status: DC | PRN
  Administered 2018-02-13: 4 mg via INTRAVENOUS

## 2018-02-13 MED ORDER — SUGAMMADEX SODIUM 200 MG/2ML IV SOLN
INTRAVENOUS | Status: DC | PRN
  Administered 2018-02-13: 200 mg via INTRAVENOUS

## 2018-02-13 MED ORDER — ROCURONIUM BROMIDE 50 MG/5ML IV SOSY
PREFILLED_SYRINGE | INTRAVENOUS | Status: DC | PRN
  Administered 2018-02-13: 50 mg via INTRAVENOUS

## 2018-02-13 MED ORDER — LACTATED RINGERS IV SOLN: INTRAVENOUS | Status: DC

## 2018-02-13 MED ORDER — ONDANSETRON HCL 4 MG/2ML IJ SOLN
INTRAMUSCULAR | Status: AC
  Filled 2018-02-13: qty 4

## 2018-02-13 MED ORDER — HYDROCOD POLST-CPM POLST ER 10-8 MG/5ML PO SUER
5.0000 mL | Freq: Two times a day (BID) | ORAL | Status: DC | PRN
  Administered 2018-02-13 – 2018-02-15 (×4): 5 mL via ORAL
  Filled 2018-02-13 (×5): qty 5

## 2018-02-13 MED ORDER — LIDOCAINE 2% (20 MG/ML) 5 ML SYRINGE
INTRAMUSCULAR | Status: DC | PRN
  Administered 2018-02-13: 40 mg via INTRAVENOUS
  Administered 2018-02-13: 60 mg via INTRAVENOUS

## 2018-02-13 MED ORDER — LIDOCAINE 2% (20 MG/ML) 5 ML SYRINGE
INTRAMUSCULAR | Status: AC
  Filled 2018-02-13: qty 15

## 2018-02-13 MED ORDER — PROPOFOL 10 MG/ML IV BOLUS
INTRAVENOUS | Status: AC
  Filled 2018-02-13: qty 20

## 2018-02-13 MED ORDER — MORPHINE SULFATE (PF) 2 MG/ML IV SOLN
2.0000 mg | INTRAVENOUS | Status: DC | PRN
  Administered 2018-02-13 – 2018-02-14 (×2): 2 mg via INTRAVENOUS
  Filled 2018-02-13 (×2): qty 1

## 2018-02-13 MED ORDER — DEXAMETHASONE SODIUM PHOSPHATE 10 MG/ML IJ SOLN
INTRAMUSCULAR | Status: AC
  Filled 2018-02-13: qty 2

## 2018-02-13 MED ORDER — MIDAZOLAM HCL 2 MG/2ML IJ SOLN
INTRAMUSCULAR | Status: AC
  Filled 2018-02-13: qty 2

## 2018-02-13 MED ORDER — FENTANYL CITRATE (PF) 100 MCG/2ML IJ SOLN
25.0000 ug | INTRAMUSCULAR | Status: DC | PRN
  Administered 2018-02-13: 25 ug via INTRAVENOUS

## 2018-02-13 MED ORDER — BENZONATATE 100 MG PO CAPS
100.0000 mg | ORAL_CAPSULE | Freq: Two times a day (BID) | ORAL | Status: DC | PRN
  Administered 2018-02-13 – 2018-02-14 (×3): 100 mg via ORAL
  Filled 2018-02-13 (×3): qty 1

## 2018-02-13 MED ORDER — FENTANYL CITRATE (PF) 100 MCG/2ML IJ SOLN
INTRAMUSCULAR | Status: DC | PRN
  Administered 2018-02-13: 100 ug via INTRAVENOUS
  Administered 2018-02-13: 50 ug via INTRAVENOUS

## 2018-02-13 SURGICAL SUPPLY — 32 items
ADAPTER VALVE BIOPSY EBUS (MISCELLANEOUS) IMPLANT
ADPTR VALVE BIOPSY EBUS (MISCELLANEOUS)
BRUSH CYTOL CELLEBRITY 1.5X140 (MISCELLANEOUS) IMPLANT
CANISTER SUCT 3000ML PPV (MISCELLANEOUS) ×3 IMPLANT
CONT SPEC 4OZ CLIKSEAL STRL BL (MISCELLANEOUS) ×3 IMPLANT
COVER BACK TABLE 60X90IN (DRAPES) ×3 IMPLANT
COVER DOME SNAP 22 D (MISCELLANEOUS) ×3 IMPLANT
FORCEPS BIOP RJ4 1.8 (CUTTING FORCEPS) IMPLANT
GAUZE SPONGE 4X4 12PLY STRL (GAUZE/BANDAGES/DRESSINGS) ×3 IMPLANT
GLOVE BIO SURGEON STRL SZ8 (GLOVE) ×3 IMPLANT
GOWN STRL REUS W/ TWL LRG LVL3 (GOWN DISPOSABLE) ×1 IMPLANT
GOWN STRL REUS W/TWL LRG LVL3 (GOWN DISPOSABLE) ×3
KIT CLEAN ENDO COMPLIANCE (KITS) ×6 IMPLANT
KIT TURNOVER KIT B (KITS) ×3 IMPLANT
MARKER SKIN DUAL TIP RULER LAB (MISCELLANEOUS) ×3 IMPLANT
NDL ASPIRATION VIZISHOT 19G (NEEDLE) IMPLANT
NDL ASPIRATION VIZISHOT 21G (NEEDLE) ×1 IMPLANT
NEEDLE ASPIRATION VIZISHOT 19G (NEEDLE) IMPLANT
NEEDLE ASPIRATION VIZISHOT 21G (NEEDLE) ×6 IMPLANT
NS IRRIG 1000ML POUR BTL (IV SOLUTION) ×3 IMPLANT
OIL SILICONE PENTAX (PARTS (SERVICE/REPAIRS)) ×3 IMPLANT
PAD ARMBOARD 7.5X6 YLW CONV (MISCELLANEOUS) ×6 IMPLANT
SYR 20CC LL (SYRINGE) ×3 IMPLANT
SYR 20ML ECCENTRIC (SYRINGE) ×6 IMPLANT
SYR 5ML LUER SLIP (SYRINGE) ×3 IMPLANT
TOWEL OR 17X24 6PK STRL BLUE (TOWEL DISPOSABLE) ×3 IMPLANT
TRAP SPECIMEN MUCOUS 40CC (MISCELLANEOUS) ×2 IMPLANT
TUBE CONNECTING 20'X1/4 (TUBING) ×2
TUBE CONNECTING 20X1/4 (TUBING) ×4 IMPLANT
VALVE BIOPSY  SINGLE USE (MISCELLANEOUS) ×4
VALVE BIOPSY SINGLE USE (MISCELLANEOUS) ×1 IMPLANT
VALVE SUCTION BRONCHIO DISP (MISCELLANEOUS) ×7 IMPLANT

## 2018-02-13 NOTE — Transfer of Care (Signed)
Immediate Anesthesia Transfer of Care Note  Patient: Jeremiah Mason  Procedure(s) Performed: VIDEO BRONCHOSCOPY WITH ENDOBRONCHIAL ULTRASOUND (N/A Chest)  Patient Location: PACU  Anesthesia Type:General  Level of Consciousness: awake, alert  and oriented  Airway & Oxygen Therapy: Patient Spontanous Breathing and Patient connected to nasal cannula oxygen  Post-op Assessment: Report given to RN, Post -op Vital signs reviewed and stable and Patient moving all extremities X 4  Post vital signs: Reviewed and stable  Last Vitals:  Vitals Value Taken Time  BP 123/71 02/13/2018  1:49 PM  Temp    Pulse 85 02/13/2018  1:49 PM  Resp 15 02/13/2018  1:49 PM  SpO2 98 % 02/13/2018  1:49 PM  Vitals shown include unvalidated device data.  Last Pain:  Vitals:   02/13/18 0800  TempSrc: Oral  PainSc:          Complications: No apparent anesthesia complications

## 2018-02-13 NOTE — Anesthesia Procedure Notes (Signed)
Procedure Name: Intubation Date/Time: 02/13/2018 12:33 PM Performed by: Neldon Newport, CRNA Pre-anesthesia Checklist: Timeout performed, Patient being monitored, Suction available, Emergency Drugs available and Patient identified Patient Re-evaluated:Patient Re-evaluated prior to induction Oxygen Delivery Method: Circle system utilized Preoxygenation: Pre-oxygenation with 100% oxygen Induction Type: IV induction Ventilation: Mask ventilation without difficulty Laryngoscope Size: Mac and 4 Grade View: Grade II Tube type: Oral Tube size: 8.5 mm Number of attempts: 2 Placement Confirmation: breath sounds checked- equal and bilateral,  positive ETCO2 and ETT inserted through vocal cords under direct vision Secured at: 23 cm Tube secured with: Tape Dental Injury: Teeth and Oropharynx as per pre-operative assessment

## 2018-02-13 NOTE — Progress Notes (Signed)
  Echocardiogram 2D Echocardiogram has been performed.  Jeremiah Mason 02/13/2018, 10:22 AM

## 2018-02-13 NOTE — Anesthesia Postprocedure Evaluation (Signed)
Anesthesia Post Note  Patient: Jeremiah Mason  Procedure(s) Performed: VIDEO BRONCHOSCOPY WITH ENDOBRONCHIAL ULTRASOUND (N/A Chest)     Patient location during evaluation: PACU Anesthesia Type: General Level of consciousness: awake and alert Pain management: pain level controlled Vital Signs Assessment: post-procedure vital signs reviewed and stable Respiratory status: spontaneous breathing, nonlabored ventilation and respiratory function stable Cardiovascular status: blood pressure returned to baseline and stable Postop Assessment: no apparent nausea or vomiting Anesthetic complications: no    Last Vitals:  Vitals:   02/13/18 1420 02/13/18 1428  BP: 118/80 115/83  Pulse: 72 75  Resp: 17 18  Temp:  (!) 36.3 C  SpO2: 96% 96%    Last Pain:  Vitals:   02/13/18 1411  TempSrc:   PainSc: [place]

## 2018-02-13 NOTE — Progress Notes (Signed)
Family Medicine Teaching Service Daily Progress Note Intern Pager: [phone]  Patient name: Jeremiah Mason Medical record number: [id number] Date of birth: [birth year] Age: 33 y.o. Gender: male  Primary Care Provider: Default, Provider, MD Consultants: pulm Code Status: Full code  Pt Overview and Major Events to Date:  Hospital Day 0 Admitted: 02/11/2018 1/28 bronchoscopy  Assessment and Plan: Jeremiah Mason is a 33 y.o. male who presented with dypsnea on exertion concerning for sarcoidosis. PMHx   dypsnea  exertional chest pain - CT chest shows multifocal consolidation noduels and bilateral hilar lymphadenopathy. Possible infection vs lymphoma vs granulmoatous disease. pulm consulted.  Negative troponins. No acute ekg changes.  Saturating on room air. PCT neg. Afebrile, no leukocytosis. Hgb 12.2. ACE level high. Quit smoking 4 years ago, current vaping. Plan for EBUS bronchoscopic biopsy 1/28.  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  Has albuterol at home. does not take albuterol at home in some time. Took some last week but it did not help  Migraines -  Currently no headaches.  At home takes topamax 50mg  daily.  excedrin migraine as needed.  - holding home meds   HTN -  Not treated at home.  Has been mostly normotensive on admition. Has had a few diastolic pressures above 90.    Fluids: none (Access )  Electrolytes: none  Nutrition: regular diet GI ppx: none DVT px: none  Disposition: home   Medications: Scheduled Meds: . mupirocin ointment  1 application Nasal BID   Continuous Infusions: PRN Meds: acetaminophen **OR** acetaminophen, albuterol, ketorolac, ondansetron **OR** ondansetron (ZOFRAN) IV  ================================================= ================================================= Subjective:   Patient states he still feels tired.  His albuterol inhaler did not help him when he tried at home.  He has been tired for about a week, even at rest.    Objective: Temp:  [97 F (36.1 C)-98.3 F (36.8 C)] 98.3 F (36.8 C) (01/27 2354) Pulse Rate:  [66-83] 83 (01/27 2354) Resp:  [12] 12 (01/27 2354) BP: (119-140)/(78-98) 140/98 (01/27 2354) SpO2:  [97 %-99 %] 97 % (01/27 2354) Weight:  [76.8 kg] 76.8 kg (01/28 0537) Intake/Output 01/27 0701 - 01/28 0700 In: 63 [P.O.:236] Out: -  Physical Exam:  Gen: NAD, alert, non-toxic, well-nourished, well-appearing, laying in bed comfortably  CV: Regular rate and rhythm.  Radial pulses 2+ bilaterally. No bilateral lower extremity edema. Resp: Clear to auscultation bilaterally.  No wheezing, rales, abnormal lung sounds.  No increased work of breathing appreciated. Abd: Nontender and nondistended on palpation to all 4 quadrants.  Positive bowel sounds. Psych: Cooperative with exam. Pleasant. Makes eye contact. Extremities: no nodules on shins.     Laboratory: Recent Labs  Lab 02/11/18 1712 02/12/18 0324  WBC 5.0 4.5  HGB 13.2 12.2*  HCT 42.0 38.3*  PLT 283 264   Recent Labs  Lab 02/11/18 1712 02/12/18 0324  NA 137 136  K 3.8 3.7  CL 99 99  CO2 26 26  BUN 11 14  CREATININE 1.09 1.17  CALCIUM 9.6 9.4  GLUCOSE 94 118*    Imaging/Diagnostic Tests: Dg Chest 2 View  Result Date: 02/11/2018 CLINICAL DATA:  Chest pain and shortness of breath EXAM: CHEST - 2 VIEW COMPARISON:  04/01/2017 FINDINGS: Cardiac shadow is within normal limits. Lungs are well aerated bilaterally. No focal infiltrate or sizable effusion is seen. No  acute bony abnormality is noted. IMPRESSION: No active cardiopulmonary disease. Electronically Signed   By: Inez Catalina M.D.   On: 02/11/2018 17:14   Ct Angio Chest Pe W And/or Wo Contrast  Result Date: 02/11/2018 CLINICAL DATA:  Shortness of breath, chest pressure EXAM: CT ANGIOGRAPHY CHEST WITH CONTRAST  TECHNIQUE: Multidetector CT imaging of the chest was performed using the standard protocol during bolus administration of intravenous contrast. Multiplanar CT image reconstructions and MIPs were obtained to evaluate the vascular anatomy. CONTRAST:  160mL ISOVUE-370 IOPAMIDOL (ISOVUE-370) INJECTION 76% COMPARISON:  Chest x-ray 02/11/2018, 04/01/2017 FINDINGS: Cardiovascular: Satisfactory opacification of the pulmonary arteries to the segmental level. No evidence of pulmonary embolism. Normal heart size. No pericardial effusion. Nonaneurysmal aorta. Mediastinum/Nodes: Midline trachea. No thyroid mass. Esophagus normal. Confluent adenopathy or soft tissue thickening at the mainstem bronchi and subcarinal regions bilaterally. This is confluent with peribronchovascular soft tissue thickening. Enlarged bilateral hilar lymph nodes. Lungs/Pleura: Nodular consolidation at the left base measuring 2.1 cm, series 7, image number 64. Nodular consolidation within the apical segment of the right lower lobe measuring 17 mm, series 7, image number 38. Multiple additional small subpleural and perihilar nodules. No pleural effusion. Upper Abdomen: No acute abnormality. Musculoskeletal: No chest wall abnormality. No acute or significant osseous findings. Review of the MIP images confirms the above findings. IMPRESSION: 1. Negative for acute pulmonary embolus. 2. Confluent adenopathy and or soft tissue thickening at the bilateral mainstem bronchi and subcarinal regions with bilateral hilar adenopathy noted. Peribronchial soft tissue thickening with multifocal masslike consolidations within the apical right lower lobe and the left lung base as described above. Additional small perihilar and subpleural nodules. Differential considerations include multifocal infection with reactive adenopathy, lymphoproliferative disease/lymphoma, and granulomatous disease such as sarcoidosis. Pulmonary consultation is suggested. Electronically Signed    By: Donavan Foil M.D.   On: 02/11/2018 19:39      Benay Pike, MD 02/13/2018, 5:59 AM PGY-1, [place] Intern pager: [phone], text pages welcome

## 2018-02-13 NOTE — Anesthesia Preprocedure Evaluation (Addendum)
Anesthesia Evaluation  Patient identified by MRN, date of birth, ID band Patient awake    Reviewed: Allergy & Precautions, NPO status , Patient's Chart, lab work & pertinent test results  History of Anesthesia Complications Negative for: history of anesthetic complications  Airway Mallampati: II  TM Distance: >3 FB Neck ROM: Full    Dental  (+) Dental Advisory Given, Chipped,    Pulmonary asthma , former smoker,  Mediastinal adenopathy   Pulmonary exam normal breath sounds clear to auscultation       Cardiovascular negative cardio ROS Normal cardiovascular exam Rhythm:Regular Rate:Normal     Neuro/Psych negative neurological ROS     GI/Hepatic negative GI ROS, Neg liver ROS,   Endo/Other  negative endocrine ROS  Renal/GU negative Renal ROS     Musculoskeletal negative musculoskeletal ROS (+)   Abdominal   Peds  Hematology negative hematology ROS (+)   Anesthesia Other Findings Day of surgery medications reviewed with the patient.  Reproductive/Obstetrics                            Anesthesia Physical Anesthesia Plan  ASA: III  Anesthesia Plan: General   Post-op Pain Management:    Induction: Intravenous  PONV Risk Score and Plan: 2 and Treatment may vary due to age or medical condition, Ondansetron, Dexamethasone and Midazolam  Airway Management Planned: Oral ETT  Additional Equipment:   Intra-op Plan:   Post-operative Plan: Extubation in OR  Informed Consent: I have reviewed the patients History and Physical, chart, labs and discussed the procedure including the risks, benefits and alternatives for the proposed anesthesia with the patient or authorized representative who has indicated his/her understanding and acceptance.     Dental advisory given and Dental Advisory Given  Plan Discussed with: CRNA and Anesthesiologist  Anesthesia Plan Comments:        Anesthesia Quick Evaluation

## 2018-02-13 NOTE — Op Note (Signed)
Video Bronchoscopy with Endobronchial Ultrasound Procedure Note  Date of Operation: 02/13/2018  Pre-op Diagnosis: mediastinal adenopathy  Post-op Diagnosis: same  Surgeon: Norlene Campbell  Assistants: none  Anesthesia: General endotracheal anesthesia  Operation: Flexible video fiberoptic bronchoscopy with endobronchial ultrasound and biopsies.  Estimated Blood Loss: Minimal  Complications: none immediate  Indications and History: Jeremiah Mason is a 33 y.o. male admitted with cough, dyspnea, found to have mediastinal adenopathy.  The risks, benefits, complications, treatment options and expected outcomes were discussed with the patient.  The possibilities of pneumothorax, pneumonia, reaction to medication, pulmonary aspiration, perforation of a viscus, bleeding, failure to diagnose a condition and creating a complication requiring transfusion or operation were discussed with the patient who freely signed the consent.    Description of Procedure: The patient was examined in the preoperative area and history and data from the preprocedure consultation were reviewed. It was deemed appropriate to proceed.  The patient was taken to OR 10, identified as Phil Dopp and the procedure verified as Flexible Video Fiberoptic Bronchoscopy.  A Time Out was held and the above information confirmed. After being taken to the operating room general anesthesia was initiated and the patient  was orally intubated. The video fiberoptic bronchoscope was introduced via the endotracheal tube and a general inspection was performed which showed some cobblestoning of the airways, otherwise normal. The standard scope was then withdrawn and the endobronchial ultrasound was used to identify and characterize the peritracheal, hilar and bronchial lymph nodes. Inspection showed enlargement of nodes 10L, 7, 11L. Using real-time ultrasound guidance Wang needle biopsies were take from Station 10L and 7 nodes and  were sent for cytology. The patient tolerated the procedure well without apparent complications. There was no significant blood loss. The bronchoscope was withdrawn. Anesthesia was reversed and the patient was taken to the PACU for recovery.   Samples: 1. Wang needle biopsies from 10L node 2. Wang needle biopsies from 7 node 3. BAL RUL: sent for cytology, culture (bacterial, fungal, AFB), cell count   Plans:  The patient will be discharged from the PACU to home when recovered from anesthesia. We will review the cytology, pathology and microbiology results with the patient when they become available. Outpatient followup will be with .   Roselie Awkward MD _0 @

## 2018-02-13 NOTE — H&P (Signed)
[facility] PCCM H&P  HPI 33 y/o male admitted for cough, dyspnea, found to have multiple lung nodules and mediastinal adenopathy.  Says that he has had several of these episodes in the last 2-3 years, none this bad.  Here today for a bronchsocopy with biopsy, EBUS.  Past Medical History:  Diagnosis Date  . Asthma   . 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 Junius Roads): helped but too expensive   Meds: Percocet/Vicodin (helped), OTC (ibuprofen)   . COMMON MIGRAINE 04/21/2009   Qualifier: Diagnosis of  By: Jeannine Kitten MD, Rodman Key    . INSOMNIA 09/14/2007   Qualifier: Diagnosis of  By: Zebedee Iba NP, Manuela Schwartz    . S/P ACL repair 2016   R knee     Family History  Problem Relation Age of Onset  . Diabetes Mother   . Hypertension Mother      Social History   Socioeconomic History  . Marital status: Single    Spouse name: Not on file  . Number of children: Not on file  . Years of education: Not on file  . Highest education level: Not on file  Occupational History    Comment: works a Visual merchandiser  . Financial resource strain: Not on file  . Food insecurity:    Worry: Not on file    Inability: Not on file  . Transportation needs:    Medical: Not on file    Non-medical: Not on file  Tobacco Use  . Smoking status: Former Smoker    Types: Cigars, Cigarettes    Last attempt to quit: 09/27/2014    Years since quitting: 3.3  . Smokeless tobacco: Never Used  . Tobacco comment: 10 pack year history  Substance and Sexual Activity  . Alcohol use: Yes    Comment: very rare  . Drug use: No  . Sexual activity: Yes  Lifestyle  . Physical activity:    Days per week: Not on file    Minutes per session: Not on file  . Stress: Not on file  Relationships  . Social connections:    Talks on phone: Not on file    Gets together: Not on file    Attends religious  service: Not on file    Active member of club or organization: Not on file    Attends meetings of clubs or organizations: Not on file    Relationship status: Not on file  . Intimate partner violence:    Fear of current or ex partner: Not on file    Emotionally abused: Not on file    Physically abused: Not on file    Forced sexual activity: Not on file  Other Topics Concern  . Not on file  Social History Narrative   Exercise regularly 2x a week, plays basketball     No Known Allergies   @encmedstart @  Vitals:   02/12/18 1624 02/12/18 2354 02/13/18 0537 02/13/18 0800  BP: 119/78 (!) 140/98  103/80  Pulse: 66 83  63  Resp:  12  18  Temp: 97.6 F (36.4 C) 98.3 F (36.8 C)  97.9 F (36.6 C)  TempSrc: Oral Oral  Oral  SpO2: 98% 97%  96%  Weight:   76.8 kg    General:  Resting comfortably in bed HENT: NCAT OP clear PULM: normal effort  CV: warm well perfused GI: soft, nontender MSK: normal bulk and tone Neuro: awake, alert, no distress, MAEW  CBC    Component Value Date/Time   WBC 4.5 02/12/2018 0324   RBC 4.51 02/12/2018 0324   HGB 12.2 (L) 02/12/2018 0324   HCT 38.3 (L) 02/12/2018 0324   PLT 264 02/12/2018 0324   MCV 84.9 02/12/2018 0324   MCH 27.1 02/12/2018 0324   MCHC 31.9 02/12/2018 0324   RDW 12.5 02/12/2018 0324   LYMPHSABS 1.2 02/11/2018 1712   MONOABS 0.8 02/11/2018 1712   EOSABS 0.2 02/11/2018 1712   BASOSABS 0.1 02/11/2018 1712    BMET    Component Value Date/Time   NA 136 02/12/2018 0324   K 3.7 02/12/2018 0324   CL 99 02/12/2018 0324   CO2 26 02/12/2018 0324   GLUCOSE 118 (H) 02/12/2018 0324   BUN 14 02/12/2018 0324   CREATININE 1.17 02/12/2018 0324   CALCIUM 9.4 02/12/2018 0324   GFRNONAA >60 02/12/2018 0324   GFRAA >60 02/12/2018 0324    CT chest images: reviewd again, scattered pulmonary nodules, some mediastinal adenopathy  Impression; Mediastinal adenopathy, bilateral pulmonary nodules, suspect sarcoidosis but ddx somewhat broad  including infectious or other inflammatory etiologies, less likely malignancy but possible.   Plan: Bronchoscopy with EBUS today  Roselie Awkward, MD [place] [facility] Pager: [phone] Cell: [phone] If no response, call [phone]

## 2018-02-14 ENCOUNTER — Encounter (HOSPITAL_COMMUNITY): Payer: Self-pay | Admitting: Pulmonary Disease

## 2018-02-14 DIAGNOSIS — R071 Chest pain on breathing: Secondary | ICD-10-CM

## 2018-02-14 DIAGNOSIS — G43009 Migraine without aura, not intractable, without status migrainosus: Secondary | ICD-10-CM

## 2018-02-14 DIAGNOSIS — R079 Chest pain, unspecified: Secondary | ICD-10-CM

## 2018-02-14 LAB — BODY FLUID CELL COUNT WITH DIFFERENTIAL
Eos, Fluid: 3 %
Lymphs, Fluid: 45 %
Monocyte-Macrophage-Serous Fluid: 15 % — ABNORMAL LOW (ref 50–90)
Neutrophil Count, Fluid: 37 % — ABNORMAL HIGH (ref 0–25)
Total Nucleated Cell Count, Fluid: 143 cu mm (ref 0–1000)

## 2018-02-14 LAB — ACID FAST SMEAR (AFB, MYCOBACTERIA): Acid Fast Smear: NEGATIVE

## 2018-02-14 LAB — ACID FAST SMEAR (AFB)

## 2018-02-14 MED ORDER — TRAMADOL HCL 50 MG PO TABS
50.0000 mg | ORAL_TABLET | Freq: Four times a day (QID) | ORAL | Status: DC | PRN
  Administered 2018-02-14 – 2018-02-15 (×3): 50 mg via ORAL
  Filled 2018-02-14 (×3): qty 1

## 2018-02-14 NOTE — Consult Note (Signed)
NAME:  Jeremiah Mason, MRN:  [id number], DOB:  1985/03/16, LOS: 1 ADMISSION DATE:  02/11/2018, CONSULTATION DATE:  02/12/18 REFERRING MD:  Dr. Hal Hope, CHIEF COMPLAINT:  SOB   Brief History   33 y/o M admitted 1/26 with reports of shortness of breath, chest pressure.    History of present illness   33 y/o M who presented to [facility] on 1/26 with a two week history of dry cough and shortness of breath.    Patient reported he began having a dry cough and associated SOB episodes.  SOB was at rest and with exertion.  Denies fever, chills, sputum production, rashes, dry eyes, weight loss.  Reports one isolated dark skin lesion on the right side of his chest and blurred vision.  Reports family history of sarcoidosis (maternal aunt).  States he works at [employer] and a [employer]  Has worked in multiple jobs with dust, metal, chemical exposures.  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    Admitted per [facility] for further evaluation.  CTA Chest assessed on admit and negative for PE. However, did show lymphadenopathy, peribronchial soft tissue thickening and multifocal mass like consolidations, nodules.    PCCM consulted for evaluation of SOB, abnormal CT.    Past Medical History  Asthma - childhood Vaping - "vapes" more than he smoked, uses 40m dose of nicotine / liquid Tobacco Abuse - quit 2016, smoked since age 33 1/2 ppd  [facility]   [facility]  [facility]mit   Consults:  PCCM 1/27   Procedures:  EBUS 1/28   Significant Diagnostic Tests:  CTA Chest 1/26 >> neg for PE, adenopathy, peribronchial soft tissue thickening, multifocal mass like consolidations, small perihilar and subpleural nodules UDS 1/27 >> not sent  ACE 1/27 >> 98 ESR 1/27 >> 27  Micro Data:  BCx2 1/26 >>  HIV 1/26 >> not sent  BAL 1/28 >>  AFB 1/28 >>  Fungal 1/28 >>   Antimicrobials:     Interim history/subjective:  Pt reports ongoing chest pain, sore throat & cough with minimal sputum production.   States pain med is helping.  Concerned about his job > will need work note at discharge.  Objective   Blood pressure 118/78, pulse 92, temperature (!) 97.5 F (36.4 C), resp. rate 16, height _0  (1.778 m), weight 76.8 kg, SpO2 98 %.        Intake/Output Summary (Last 24 hours) at 02/14/2018 1103 Last data filed at 02/13/2018 1428 Gross per 24 hour  Intake 1070 ml  Output 5 ml  Net 1065 ml   Filed Weights   02/13/18 0537  Weight: 76.8 kg    Examination: General: pleasant adult male lying in bed in NAD HEENT: MM pink/moist, good dentition  Neuro: AAOx4, speech clear, MAE  CV: s1s2 rrr, no m/r/g PULM: even/non-labored, lungs bilaterally clear anterior, few fine bibasilar crackles  GEB:XIDH non-tender, bsx4 active  Extremities: warm/dry, no edema  Skin: no rashes or lesions, multiple tattoos  Resolved Hospital Problem list      Assessment & Plan:   Bilateral Nodular Opacities  Mediastinal Lymphadenopathy  -ddx includes infectious/viral, sarcoidosis, vaping / occupational exposures and malignancy  P: Await final pathology, cultures from EBUS Continue Tessalon / Tussionex for cough suppression  Solumedrol 1244mQ12 with transition to prednisone, consider 48-72 hours on high dose before transition.  Defer to MD. Arranged for outpatient pulmonary follow up with Dr. McLake Bells2/12 at 10am with BrWyn QuakerNP) Will need opthalmologic follow  up if confirmed sarcoid  Vaping Use  P: Cessation counseling  UDS not sent (would not likely be of value at this point)  Best practice:  Diet: As tolerated  Pain/Anxiety/Delirium protocol (if indicated): n/a VAP protocol (if indicated): n/a DVT prophylaxis: per primary  GI prophylaxis: n/a Glucose control: n/a Mobility: as tolerated  Code Status: full code  Family Communication: Mother, father & aunt updated at bedside.  Disposition: Per [facility]   Labs   CBC: Recent Labs  Lab 02/11/18 1712 02/12/18 0324  WBC 5.0 4.5    NEUTROABS 2.8  --   HGB 13.2 12.2*  HCT 42.0 38.3*  MCV 85.4 84.9  PLT 283 196    Basic Metabolic Panel: Recent Labs  Lab 02/11/18 1712 02/12/18 0324  NA 137 136  K 3.8 3.7  CL 99 99  CO2 26 26  GLUCOSE 94 118*  BUN 11 14  CREATININE 1.09 1.17  CALCIUM 9.6 9.4   GFR: Estimated Creatinine Clearance: 94.5 mL/min (by C-G formula based on SCr of 1.17 mg/dL). Recent Labs  Lab 02/11/18 1712 02/12/18 0324 02/12/18 0649  PROCALCITON  --   --  <0.10  WBC 5.0 4.5  --     Liver Function Tests: No results for input(s): AST, ALT, ALKPHOS, BILITOT, PROT, ALBUMIN in the last 168 hours. No results for input(s): LIPASE, AMYLASE in the last 168 hours. No results for input(s): AMMONIA in the last 168 hours.  ABG No results found for: PHART, PCO2ART, PO2ART, HCO3, TCO2, ACIDBASEDEF, O2SAT   Coagulation Profile: Recent Labs  Lab 02/12/18 2129  INR 1.02    Cardiac Enzymes: Recent Labs  Lab 02/11/18 2157 02/12/18 0324 02/12/18 0949  TROPONINI <0.03 <0.03 <0.03    HbA1C: No results found for: HGBA1C  CBG: No results for input(s): GLUCAP in the last 168 hours.   Critical care time: n/a    Noe Gens, NP-C [facility] Pgr: [phone] or if no answer [phone] 02/14/2018, 11:03 AM

## 2018-02-14 NOTE — Progress Notes (Signed)
Family Medicine Teaching Service Daily Progress Note Intern Pager: [phone]  Patient name: Jeremiah Mason Medical record number: [id number] Date of birth: 1985-03-07 Age: 33 y.o. Gender: male  Primary Care Provider: Default, Provider, MD Consultants: pulm Code Status: Full code  Pt Overview and Major Events to Date:  Hospital Day 1 Admitted: 02/11/2018 1/28 bronchoscopy  Assessment and Plan: HURSHELL DINO is a 33 y.o. male who presented with dypsnea on exertion concerning for sarcoidosis. PMHx   dypsnea  exertional chest pain - chest pain and dyspnea improved but still present. CT chest shows multifocal consolidation noduels and bilateral hilar lymphadenopathy. Possible infection vs lymphoma vs granulmoatous disease. pulm consulted.  Negative troponins. No acute ekg changes. ECHO normal. Saturating on room air. PCT neg. Afebrile, no leukocytosis. Hgb 12.2. ACE level high. Quit smoking 4 years ago, current vaping.  EBUS bronchoscopic biopsy, performed, awaiting cytology.  - f/u bronchoscopy - f/u blood cultures - methylprednisolone BID 125mg  - tramadol 50mg  6h PRN - CCM consulted, appreciate recs - pulse ox - albuterol PRN - continuous cardiac monitoring  Exercise induced asthma - listed in problem list.  Has albuterol at home. does not take albuterol at home in some time. Took some last week but it did not help  Migraines -  Currently no headaches.  At home takes topamax 50mg  daily.  excedrin migraine as needed.  - holding home meds   HTN -  Not treated at home.  Has been mostly normotensive on admition. Has had a few diastolic pressures above 90.    Fluids: none (Access )  . lactated ringers Stopped (02/13/18 1513)   Electrolytes: none  Nutrition: regular diet GI ppx: none DVT px: none  Disposition: home   Medications: Scheduled Meds: . methylPREDNISolone (SOLU-MEDROL) injection  125 mg Intravenous Q12H  . mupirocin ointment  1 application Nasal BID    Continuous Infusions: . lactated ringers Stopped (02/13/18 1513)   PRN Meds: acetaminophen **OR** acetaminophen, albuterol, benzonatate, chlorpheniramine-HYDROcodone, morphine injection, ondansetron **OR** ondansetron (ZOFRAN) IV, phenol  ================================================= ================================================= Subjective:  Patient states he feels better today than yesterday after his bronchoscopy . He still has bilateralchest pain with deep breathing and cough.    Objective: Temp:  [97.3 F (36.3 C)-98.7 F (37.1 C)] 98.6 F (37 C) (01/28 2307) Pulse Rate:  [63-98] 98 (01/28 2307) Resp:  [15-20] 16 (01/28 2307) BP: (103-141)/(71-95) 135/82 (01/28 2307) SpO2:  [96 %-100 %] 99 % (01/28 2307) Intake/Output 01/28 0701 - 01/29 0700 In: 26 [I.V.:1020] Out: 5 [Blood:5] Physical Exam:  Gen: NAD, alert, non-toxic, well-nourished, well-appearing, laying in bed comfortably  CV: Regular rate and rhythm.  Radial pulses 2+ bilaterally. No bilateral lower extremity edema. Resp: Clear to auscultation bilaterally.  No wheezing, rales, abnormal lung sounds.  No increased work of breathing appreciated. Abd: Nontender and nondistended on palpation to all 4 quadrants.  Positive bowel sounds. Psych: Cooperative with exam. Pleasant. Makes eye contact. Extremities: no nodules on shins.     Laboratory: Recent Labs  Lab 02/11/18 1712 02/12/18 0324  WBC 5.0 4.5  HGB 13.2 12.2*  HCT 42.0 38.3*  PLT 283 264   Recent Labs  Lab 02/11/18 1712 02/12/18 0324  NA 137 136  K 3.8 3.7  CL 99 99  CO2 26 26  BUN 11 14  CREATININE 1.09 1.17  CALCIUM 9.6 9.4  GLUCOSE 94 118*    Imaging/Diagnostic Tests: Dg Chest Port 1 View  Result Date: 02/13/2018 CLINICAL DATA:  Increased cough and chest pain  following bronchoscopy EXAM: PORTABLE CHEST 1 VIEW COMPARISON:  02/11/2018 FINDINGS: Cardiac shadows within normal limits. Mild fullness is noted consistent with the known  history of hilar and mediastinal adenopathy. The overall inspiratory effort is poor. No pneumothorax is noted. Mild left basilar atelectasis is noted. No bony abnormality is noted. IMPRESSION: Poor inspiratory effort with mild left basilar atelectasis. Electronically Signed   By: Inez Catalina M.D.   On: 02/13/2018 16:50      Benay Pike, MD 02/14/2018, 5:59 AM PGY-1, [place] Intern pager: [phone], text pages welcome

## 2018-02-14 NOTE — Discharge Summary (Signed)
[facility] Discharge Summary  Patient name: Jeremiah Mason Medical record number: [id number] Date of birth: 04/03/1985 Age: 33 y.o. Gender: male Date of Admission: 02/11/2018  Date of Discharge: 02/15/2018 Admitting Physician: Rise Patience, MD  Primary Care Provider: Default, Provider, MD Consultants: pulmonology   Indication for Hospitalization: dyspnea, cough  Discharge Diagnoses/Problem List:  Pulmonary Sarcoidosis Migraines HTN  Disposition: home  Discharge Condition: improved, stable  Discharge Exam:  BP (!) 138/92   Pulse 74   Temp 98 F (36.7 C) (Oral)   Resp 16   Ht 5\' 10"  (1.778 m)   Wt 76.8 kg   SpO2 100%   BMI 24.29 kg/m  Gen: NAD, alert, non-toxic, laying in bed.  CV: Regular rate and rhythm.  Radial pulses 2+ bilaterally. No bilateral lower extremity edema. Resp: Clear to auscultation bilaterally.  No wheezing, rales, abnormal lung sounds.  No increased work of breathing appreciated. Abd: Nontender and nondistended on palpation to all 4 quadrants.  Positive bowel sounds. Psych: Cooperative with exam. Pleasant. Makes eye contact. Extremities: no nodules on shins.    Brief Hospital Course:  Patient presented to the ED with complaints of chest tightness and shortness of breath.  CT chest performed in the ED showed adenopathy and nodules requiring further workup.  Pulmonary Critical care was consulted and performed a bronchoscopy, which showed granulomas indicating likely pulmonary sarcoidosis.  Patient was started on high dose methylprednisolone. After the bronchoscopy the patient had chest pain.  CXR was ordered but was unchanged from previous.  Chest pain gradually improved but was still present on discharge.  Patient did not have any other complaints during his admission.  He was discharged home on 30 days of prednisone.    Issues for Follow Up:  1. Sarcoidosis - patient sent home with 30 days of prednisone and follow up  with pulmonology for new diagnosis of sarcoidosis.  Ask if patient has received medications and is still taking them 2. Chest pain - patient was having chest pain that worsened after bronchoscopy.  Patient was sent home with prescription for cough suppressant to help minimize pain. Assess paiten't scurrent level of chest pain and if it has improved/worsened since discharge.  3. Insurance - patient listed as 'medicaid potential' and note from [place] on last day of admission states he was dnied medicaid. Ask patient what he is planning on doing for insurance and if he needs assistance.  May be eligible for orange card.    Significant Procedures: bronchoscopy  Significant Labs and Imaging:  Recent Labs  Lab 02/11/18 1712 02/12/18 0324  WBC 5.0 4.5  HGB 13.2 12.2*  HCT 42.0 38.3*  PLT 283 264   Recent Labs  Lab 02/11/18 1712 02/12/18 0324  NA 137 136  K 3.8 3.7  CL 99 99  CO2 26 26  GLUCOSE 94 118*  BUN 11 14  CREATININE 1.09 1.17  CALCIUM 9.6 9.4      Results/Tests Pending at Time of Discharge: none  Discharge Medications:  Allergies as of 02/15/2018   No Known Allergies     Medication List    STOP taking these medications   HYDROcodone-homatropine 5-1.5 MG/5ML syrup Commonly known as:  HYDROMET   ibuprofen 800 MG tablet Commonly known as:  ADVIL,MOTRIN   topiramate 50 MG tablet Commonly known as:  TOPAMAX     TAKE these medications   albuterol 108 (90 Base) MCG/ACT inhaler Commonly known as:  PROVENTIL HFA;VENTOLIN HFA Inhale 2 puffs into  the lungs every 6 (six) hours as needed for wheezing.   aspirin-acetaminophen-caffeine 250-250-65 MG tablet Commonly known as:  EXCEDRIN MIGRAINE Take 1 tablet by mouth 3 (three) times daily as needed for headache or migraine.   chlorpheniramine-HYDROcodone 10-8 MG/5ML Suer Commonly known as:  TUSSIONEX Take 5 mLs by mouth every 12 (twelve) hours as needed for cough.   predniSONE 20 MG tablet Commonly known as:   DELTASONE Take 2 tablets (40 mg total) by mouth daily with breakfast for 30 days.       Discharge Instructions: Please refer to Patient Instructions section of EMR for full details.  Patient was counseled important signs and symptoms that should prompt return to medical care, changes in medications, dietary instructions, activity restrictions, and follow up appointments.   Follow-Up Appointments: Follow-up [facility] Follow up on 03/14/2018.   Why:  10 am for hospital follow up.  Please arrive at 9:45 for check in.   Contact information: [place] [address] [phone]       Melvenia Needles, NP. Schedule an appointment as soon as possible for a visit on 03/01/2018.   Specialty:  Pulmonary Disease Why:  Appt at 2:30 PM.  Please arrive at 2:15 for check in Contact information: [address] [place] [address] [phone]           Benay Pike, MD 02/16/2018, 8:19 PM PGY-1, [place]

## 2018-02-15 ENCOUNTER — Encounter: Payer: Self-pay | Admitting: Pulmonary Disease

## 2018-02-15 DIAGNOSIS — D869 Sarcoidosis, unspecified: Secondary | ICD-10-CM

## 2018-02-15 DIAGNOSIS — R918 Other nonspecific abnormal finding of lung field: Secondary | ICD-10-CM

## 2018-02-15 DIAGNOSIS — R911 Solitary pulmonary nodule: Secondary | ICD-10-CM

## 2018-02-15 LAB — RAPID URINE DRUG SCREEN, HOSP PERFORMED
Amphetamines: NOT DETECTED
Barbiturates: NOT DETECTED
Benzodiazepines: POSITIVE — AB
Cocaine: NOT DETECTED
Opiates: POSITIVE — AB
Tetrahydrocannabinol: NOT DETECTED

## 2018-02-15 LAB — CULTURE, RESPIRATORY W GRAM STAIN: Culture: NORMAL

## 2018-02-15 MED ORDER — PREDNISONE 20 MG PO TABS: 40.0000 mg | ORAL_TABLET | Freq: Every day | ORAL | 0 refills | Status: DC

## 2018-02-15 MED ORDER — HYDROCOD POLST-CPM POLST ER 10-8 MG/5ML PO SUER: 5.0000 mL | Freq: Two times a day (BID) | ORAL | 0 refills | Status: DC | PRN

## 2018-02-15 MED FILL — predniSONE 20 MG TABS: 20 | 30 days supply | Qty: 60 | Fill #0

## 2018-02-15 MED FILL — HYDROCODONE-CHLORPHEN ER SU: 10-8 | 7 days supply | Qty: 140 | Fill #0

## 2018-02-15 NOTE — Social Work (Addendum)
CSW has f/u with financial counselor Kisha Siler, she had met with pt on 1/27 to initiate screen for Medicaid. Per her notes at this time pt does not qualify for Medicaid.  CSW signing off. Please consult if any additional needs arise.  Isabel H Chasse, LCSWA [place] Clinical Social Work [phone]   

## 2018-02-15 NOTE — Progress Notes (Signed)
Pt seen by MD, orders written for d/c.  Went over discharge instructions with pt and answered all questions.  Removed IV & telemetry.  Escorted for discharge via wheelchair with all belongings.  Will follow up outpatient with MD.

## 2018-02-15 NOTE — Care Management Note (Signed)
Case Management Note  Patient Details  Name: KERBY BORNER MRN: [id number] Date of Birth: [birth year]  Subjective/Objective:   From home, acute resp failure with hypoxia, pta indep, he goes to [facility] , his mom states he has not been there in a year, NCM set him up with apt at the [facility] clinic fro 2/26  , added to AVS, NCM gave Match letter to [facility] pharmacy,scripts will need to be sent to [place].                                Action/Plan: DC home when meds received from [place].  Expected Discharge Date:                  Expected Discharge Plan:  Home/Self Care  In-House Referral:     Discharge planning Services  CM Consult, Follow-up appt scheduled, Medication Assistance, MATCH Program  Post Acute Care Choice:    Choice offered to:     DME Arranged:    DME Agency:     HH Arranged:    [place] Agency:     Status of Service:  Completed, signed off  If discussed at [employer] of Stay Meetings, dates discussed:    Additional Comments:  Zenon Mayo, RN 02/15/2018, 12:33 PM

## 2018-02-15 NOTE — Discharge Instructions (Signed)
You will need to take prednisone as prescribed for your sarcoidosis.  You should take 2 tabletes (40mg ) with breakfast every morning for 30 days.  You will also need to schedule a follow up appointment with the pulmonologist and with your primary care doctor.  For your chest pain you can take over the counter NSAIDs like Ibuprofen as well as tylenol.  Take them as directed on their label.      Sarcoidosis  Sarcoidosis is a disease that can cause inflammation in many areas of the body. It most often affects the lungs (pulmonary sarcoidosis). Sarcoidosis can also affect the lymph nodes, liver, eyes, skin, heart, or any other body tissue. Normally, cells that are part of your body's disease-fighting system (immune system) attack harmful substances (such as germs) in your body. This immune system response causes inflammation. After the harmful substance is destroyed, the inflammation and the immune cells go away. When you have sarcoidosis, your immune system causes inflammation even when there are no harmful substances, and the inflammation does not go away. Sarcoidosis also causes cells from your immune system to form small clumps of tissue (granulomas) in the affected area of your body. What are the causes? The exact cause of sarcoidosis is not known.  It is possible that if you have a family history of this disease (genetic predisposition), the immune system response that leads to inflammation may be triggered by something in your environment, such as:  Bacteria or viruses.  Metals.  Chemicals.  Dust.  Mold or mildew. What increases the risk? You may be at a greater risk for sarcoidosis if you:  Have a family history of the disease.  Are African-American.  Are of Northern European descent.  Are 55-36 years old.  Work as a Airline pilot.  Work in an environment where you are exposed to metals, chemicals, mold or mildew, or insecticides. What are the signs or symptoms? Some people  with sarcoidosis have no symptoms. Others have very mild symptoms. The symptoms usually depend on the organ that is affected. Sarcoidosis most often affects the lungs, which may include symptoms such as:  Chest pain.  Coughing.  Wheezing.  Shortness of breath. Other common symptoms include:  Night sweats.  Fever.  Weight loss.  Fatigue.  Swollen lymph nodes.  Joint pain. How is this diagnosed? Sarcoidosis may be diagnosed based on:  Your symptoms and medical history.  A physical exam.  Imaging tests to check for granulomas such as: ? Chest X-ray. ? CT scan. ? MRI. ? PET scan.  Lung function tests. These tests evaluate your breathing and check for problems that may be related to sarcoidosis.  A procedure to remove a tissue sample for testing (biopsy). You may have a biopsy of lung tissue if that is where you are having symptoms. You may have tests to check for any complications of the condition. These tests may include:  Eye exams.  MRI of the heart or brain.  Echocardiogram.  Electrocardiogram (EKG or ECG). How is this treated? In some cases, sarcoidosis does not require a specific treatment because it causes no symptoms or mild symptoms. If your symptoms bother you or are severe, you may be prescribed medicines to reduce inflammation or relieve symptoms. These medicines may include:  Prednisone. This is a steroid that reduces inflammation related to sarcoidosis.  Hydroxychloroquine. This may be used to treat sarcoidosis that affects the skin, eyes, or brain.  Methotrexate, leflunomide, or azathioprine. These medicines affect the immune system and can help  with sarcoidosis in the joints, eyes, skin, or lungs.  Medicines that you breathe in (inhalers). Inhalers can help you breathe if sarcoidosis affects your lungs. Follow these instructions at home:   Do not use any products that contain nicotine or tobacco, such as cigarettes and e-cigarettes. If you need  help quitting, ask your health care provider.  Avoid secondhand smoke and irritating dust or chemicals. Stay indoors on days when air quality is poor in your area.  Return to your normal activities as told by your health care provider. Ask your health care provider what activities are safe for you.  Take or use over-the-counter and prescription medicines only as told by your health care provider.  Keep all follow-up visits as told by your health care provider. This is important. Contact a health care provider if:  You have vision problems.  You have a dry cough that does not go away.  You have an irregular heartbeat.  You have pain or aches in your joints, hands, or feet.  You have an unexplained rash. Get help right away if:  You have chest pain.  You have difficulty breathing. Summary  Sarcoidosis is a disease that can cause inflammation in many body areas of the body. It most often affects the lungs (pulmonary sarcoidosis). It can also affect the lymph nodes, liver, eyes, skin, heart, or any other body tissue.  When you have sarcoidosis, cells from your immune system form small clumps of tissue (granulomas) in the affected area of your body.  Sarcoidosis sometimes does not require a specific treatment because it causes no symptoms or mild symptoms.  If your symptoms bother you or are severe, you may be prescribed medicines to reduce inflammation or relieve symptoms. This information is not intended to replace advice given to you by your health care provider. Make sure you discuss any questions you have with your health care provider. Document Released: 11/04/2003 Document Revised: 10/11/2016 Document Reviewed: 10/11/2016 Elsevier Interactive Patient Education  2019 [employer].

## 2018-02-15 NOTE — Progress Notes (Signed)
Family Medicine Teaching Service Daily Progress Note Intern Pager: [phone]  Patient name: Jeremiah Mason Medical record number: [id number] Date of birth: [birth year] Age: 33 y.o. Gender: male  Primary Care Provider: Default, Provider, MD Consultants: pulm Code Status: Full code  Pt Overview and Major Events to Date:  Hospital Day 2 Admitted: 02/11/2018 1/28 bronchoscopy  Assessment and Plan: Jeremiah Mason is a 33 y.o. male who presented with dypsnea on exertion concerning for sarcoidosis. PMHx   dypsnea  exertional chest pain - chest pain and dyspnea improved but still present. CT chest shows multifocal consolidation noduels and bilateral hilar lymphadenopathy. Possible infection vs lymphoma vs granulmoatous disease. pulm consulted.  Negative troponins. No acute ekg changes. ECHO normal. Saturating on room air. PCT neg. Afebrile, no leukocytosis. Hgb 12.2. ACE level high. Quit smoking 4 years ago, current vaping.  EBUS bronchoscopic biopsy, performed, cytology showing granulomatous clusters suggesting sarcoidosis. No signs of malignancy. UDS positive for benzos and opiates.  (pt on tramadol).  - f/u bronchoscopy - f/u blood cultures - methylprednisolone BID 125mg  - tramadol 50mg  6h PRN - tussionex 63ml q12h PRN for cough.  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  Has albuterol at home. does not take albuterol at home in some time. Took some last week but it did not help  Migraines -  Currently no headaches.  At home takes topamax 50mg  daily and excedrin migraine as needed.  - holding home meds   HTN -  Not treated at home.  Has been mostly normotensive on admition. Has had a few diastolic pressures above 90.    Fluids: none (Access )  . lactated ringers Stopped (02/13/18 1513)   Electrolytes: none  Nutrition: regular  diet GI ppx: none DVT px: none  Disposition: home   Medications: Scheduled Meds: . methylPREDNISolone (SOLU-MEDROL) injection  125 mg Intravenous Q12H  . mupirocin ointment  1 application Nasal BID   Continuous Infusions: . lactated ringers Stopped (02/13/18 1513)   PRN Meds: acetaminophen **OR** acetaminophen, albuterol, benzonatate, chlorpheniramine-HYDROcodone, ondansetron **OR** ondansetron (ZOFRAN) IV, phenol, traMADol  ================================================= ================================================= Subjective:  Patient states he still has chest pain when coughing or laughing.  Its about the same as yesterday.  He is not having dyspnea on exertion.  He thinks the cough medicine is helping with the pain.   Objective: Temp:  [97.5 F (36.4 C)-98 F (36.7 C)] 98 F (36.7 C) (01/29 2256) Pulse Rate:  [81-92] 81 (01/29 1724) Resp:  [16] 16 (01/29 2256) BP: (118-151)/(78-91) 151/91 (01/29 2256) SpO2:  [98 %-100 %] 99 % (01/29 2256) Intake/Output 01/29 0701 - 01/30 0700 In: 240 [P.O.:240] Out: 125 [Urine:125] Physical Exam:  Gen: NAD, alert, non-toxic, laying in bed.  CV: Regular rate and rhythm.  Radial pulses 2+ bilaterally. No bilateral lower extremity edema. Resp: Clear to auscultation bilaterally.  No wheezing, rales, abnormal lung sounds.  No increased work of breathing appreciated. Abd: Nontender and nondistended on palpation to all 4 quadrants.  Positive bowel sounds. Psych: Cooperative with exam. Pleasant. Makes eye contact. Extremities: no nodules on shins.     Laboratory: Recent Labs  Lab 02/11/18 1712 02/12/18 0324  WBC 5.0 4.5  HGB 13.2 12.2*  HCT 42.0 38.3*  PLT 283 264   Recent Labs  Lab 02/11/18 1712 02/12/18 0324  NA 137 136  K 3.8 3.7  CL 99  99  CO2 26 26  BUN 11 14  CREATININE 1.09 1.17  CALCIUM 9.6 9.4  GLUCOSE 94 118*    Imaging/Diagnostic Tests: Dg Chest Port 1 View  Result Date: 02/13/2018 CLINICAL DATA:   Increased cough and chest pain following bronchoscopy EXAM: PORTABLE CHEST 1 VIEW COMPARISON:  02/11/2018 FINDINGS: Cardiac shadows within normal limits. Mild fullness is noted consistent with the known history of hilar and mediastinal adenopathy. The overall inspiratory effort is poor. No pneumothorax is noted. Mild left basilar atelectasis is noted. No bony abnormality is noted. IMPRESSION: Poor inspiratory effort with mild left basilar atelectasis. Electronically Signed   By: Inez Catalina M.D.   On: 02/13/2018 16:50      Benay Pike, MD 02/15/2018, 6:07 AM PGY-1, [place] Intern pager: [phone], text pages welcome

## 2018-02-15 NOTE — Progress Notes (Addendum)
NAME:  Jeremiah Mason, MRN:  [id number], DOB:  [birth year], LOS: 2 ADMISSION DATE:  02/11/2018, CONSULTATION DATE:  02/12/18 REFERRING MD:  Dr. Hal Hope, CHIEF COMPLAINT:  SOB   Brief History   33 y/o M admitted 1/26 with reports of shortness of breath, chest pressure.    History of present illness   33 y/o M who presented to [facility] on 1/26 with a two week history of dry cough and shortness of breath.    Patient reported he began having a dry cough and associated SOB episodes.  SOB was at rest and with exertion.  Denies fever, chills, sputum production, rashes, dry eyes, weight loss.  Reports one isolated dark skin lesion on the right side of his chest and blurred vision.  Reports family history of sarcoidosis (maternal aunt).  States he works at [employer] and a [employer]  Has worked in multiple jobs with dust, metal, chemical exposures.  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    Admitted per [facility] for further evaluation.  CTA Chest assessed on admit and negative for PE. However, did show lymphadenopathy, peribronchial soft tissue thickening and multifocal mass like consolidations, nodules.    PCCM consulted for evaluation of SOB, abnormal CT.    Past Medical History  Asthma - childhood Vaping - "vapes" more than he smoked, uses 77m dose of nicotine / liquid Tobacco Abuse - quit 2016, smoked since age 33 1/2 ppd  [facility]  [facility]   Consults:  PCCM 1/27   Procedures:  EBUS 1/28   Significant Diagnostic Tests:  CTA Chest 1/26 >> neg for PE, adenopathy, peribronchial soft tissue thickening, multifocal mass like consolidations, small perihilar and subpleural nodules UDS 1/27 >> not sent  ACE 1/27 >> 98 ESR 1/27 >> 27  Micro Data:  BCx2 1/26 >>  HIV 1/26 >> not sent  BAL 1/28 >> negative  AFB 1/28 >> smear negative >>  Fungal 1/28 >>   Antimicrobials:     Interim history/subjective:  Pt anxious to go home.  Mother at bedside.   Objective     Blood pressure (!) 138/92, pulse 74, temperature 98 F (36.7 C), temperature source Oral, resp. rate 16, height _0  (1.778 m), weight 76.8 kg, SpO2 100 %.        Intake/Output Summary (Last 24 hours) at 02/15/2018 1028 Last data filed at 02/15/2018 0515 Gross per 24 hour  Intake 240 ml  Output 125 ml  Net 115 ml   Filed Weights   02/13/18 0537  Weight: 76.8 kg    Examination: General: young adult male lying in bed in NAD HEENT: MM pink/moist, good dentition  Neuro: AAOx4, speech clear, MAE  CV: s1s2 rrr, no m/r/g PULM: even/non-labored, lungs bilaterally clear  GYI:FOYD non-tender, bsx4 active  Extremities: warm/dry, no edema  Skin: no rashes or lesions, multiple tattoos  Resolved Hospital Problem list      Assessment & Plan:   Bilateral Nodular Opacities consistent with Sarcoidosis Mediastinal Lymphadenopathy  -s/p EBUS, pathology with granulomas, negative for malignant cells  -EKG on admit normal SR P: Transition to prednisone 40 mg QD and stay for one month PRN tussionex / tessalon  Outpatient pulmonary follow up  Will need ophthalmologic evaluation  May need modified work duty for 1-2 weeks given chest discomfort / fatigue  Vaping Use  P: Cessation counseling   Best practice:  Diet: As tolerated  Pain/Anxiety/Delirium protocol (if indicated): n/a VAP protocol (if indicated): n/a  DVT prophylaxis: per primary  GI prophylaxis: n/a Glucose control: n/a Mobility: as tolerated  Code Status: full code  Family Communication: Mother & patient updated at bedside 1/30. Pt ok to discharge from pulmonary standpoint  Disposition: Per TRH   Labs   CBC: Recent Labs  Lab 02/11/18 1712 02/12/18 0324  WBC 5.0 4.5  NEUTROABS 2.8  --   HGB 13.2 12.2*  HCT 42.0 38.3*  MCV 85.4 84.9  PLT 283 837    Basic Metabolic Panel: Recent Labs  Lab 02/11/18 1712 02/12/18 0324  NA 137 136  K 3.8 3.7  CL 99 99  CO2 26 26  GLUCOSE 94 118*  BUN 11 14  CREATININE  1.09 1.17  CALCIUM 9.6 9.4   GFR: Estimated Creatinine Clearance: 94.5 mL/min (by C-G formula based on SCr of 1.17 mg/dL). Recent Labs  Lab 02/11/18 1712 02/12/18 0324 02/12/18 0649  PROCALCITON  --   --  <0.10  WBC 5.0 4.5  --     Liver Function Tests: No results for input(s): AST, ALT, ALKPHOS, BILITOT, PROT, ALBUMIN in the last 168 hours. No results for input(s): LIPASE, AMYLASE in the last 168 hours. No results for input(s): AMMONIA in the last 168 hours.  ABG No results found for: PHART, PCO2ART, PO2ART, HCO3, TCO2, ACIDBASEDEF, O2SAT   Coagulation Profile: Recent Labs  Lab 02/12/18 2129  INR 1.02    Cardiac Enzymes: Recent Labs  Lab 02/11/18 2157 02/12/18 0324 02/12/18 0949  TROPONINI <0.03 <0.03 <0.03    HbA1C: No results found for: HGBA1C  CBG: No results for input(s): GLUCAP in the last 168 hours.   Critical care time: n/a    Noe Gens, NP-C [facility] Pgr: [phone] or if no answer [phone] 02/15/2018, 10:28 AM  Attending Note:  33 year old male presenting with a pulmonary nodule and mediastinal LAN.  Patient underwent an EBUS and results were reviewed with granulomas noted.  No complaints.  On exam, lungs are clear.  I reviewed chest CT myself, mediastinal LAN noted.  Discussed with PCCM-NP and resident.  Sarcoidosis: most likely diagnosis  - Prednisone 40 mg PO daily for 4 wk  - F/u in pulmonary clinic in 2 wks  Mediastinal LAN  - Prednisone  Pulmonary Nodule:  - F/U as outpatient with CT after being on steroids.  Ok to d/c with recs above.    PCCM will sign off, please call back if needed.  Patient seen and examined, agree with above note.  I dictated the care and orders written for this patient under my direction.  Rush Farmer, [place]

## 2018-02-17 LAB — CULTURE, BLOOD (ROUTINE X 2)
Culture: NO GROWTH
Culture: NO GROWTH
Special Requests: ADEQUATE
Special Requests: ADEQUATE

## 2018-02-22 ENCOUNTER — Inpatient Hospital Stay: Payer: Self-pay | Admitting: Family Medicine

## 2018-02-22 NOTE — Progress Notes (Deleted)
   Subjective:    Patient ID: Jeremiah Mason, male    DOB: 01/10/1986, 33 y.o.   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  Discharged with 30-day supply of prednisone, will be following up with pulmonary on 2/13.  1. Sarcoidosis - patient sent home with 30 days of prednisone and follow up with pulmonology for new diagnosis of sarcoidosis.  Ask if patient has received medications and is still taking them 2. Chest pain - patient was having chest pain that worsened after bronchoscopy.  Patient was sent home with prescription for cough suppressant to help minimize pain. Assess paiten't scurrent level of chest pain and if it has improved/worsened since discharge.  3. Insurance - patient listed as 'medicaid potential' and note from [place] on last day of admission states he was dnied medicaid. Ask patient what he is planning on doing for insurance and if he needs assistance.  May be eligible for orange card.  Health maintenance: Overdue for influenza vaccine.   Smoking status reviewed  Review of Systems Per HPI, also denies recent illness, fever, headache, changes in vision, chest pain, shortness of breath, abdominal pain, N/V/D, weakness   Patient Active Problem List   Diagnosis Date Noted  . Pulmonary nodules   . Chest pain   . Exertional dyspnea 02/13/2018  . Adenopathy, hilar 02/12/2018  . Multifocal lung consolidation ([place]) 02/12/2018  . Abnormal chest CT 02/12/2018  . Acute respiratory failure with hypoxia ([place]) 02/11/2018  . Exertional asthma 02/11/2018  . DOE (dyspnea on exertion) 02/11/2018  . Exercise-induced asthma 10/09/2016  . Change in skin mole 10/09/2016  . S/P ACL repair 07/15/2014  . Elevated BP 03/14/2014  . Preventative health care 11/02/2011  . Migraine without aura  04/21/2009  . BACK PAIN 01/30/2008  . INSOMNIA 09/14/2007     Objective:  There were no vitals taken for this visit. Vitals and nursing note reviewed  General: NAD, pleasant Cardiac: RRR, normal heart sounds, no murmurs Respiratory: CTAB, normal effort Abdomen: soft, nontender, nondistended Extremities: no edema or cyanosis. WWP. Skin: warm and dry, no rashes noted Neuro: alert and oriented, no focal deficits Psych: normal affect  Assessment & Plan:    No problem-specific Assessment & Plan notes found for this encounter.    Darrelyn Hillock, DO Family Medicine Resident PGY-1

## 2018-02-28 ENCOUNTER — Inpatient Hospital Stay: Payer: Self-pay | Admitting: Pulmonary Disease

## 2018-03-01 ENCOUNTER — Encounter: Payer: Self-pay | Admitting: Adult Health

## 2018-03-01 ENCOUNTER — Ambulatory Visit (INDEPENDENT_AMBULATORY_CARE_PROVIDER_SITE_OTHER): Payer: Self-pay | Admitting: Adult Health

## 2018-03-01 VITALS — BP 116/78 | HR 88 | Ht 70.0 in | Wt 180.2 lb

## 2018-03-01 DIAGNOSIS — Z23 Encounter for immunization: Secondary | ICD-10-CM

## 2018-03-01 DIAGNOSIS — D869 Sarcoidosis, unspecified: Secondary | ICD-10-CM

## 2018-03-01 LAB — HEPATIC FUNCTION PANEL
ALT: 56 U/L — AB (ref 0–53)
AST: 21 U/L (ref 0–37)
Albumin: 4.3 g/dL (ref 3.5–5.2)
Alkaline Phosphatase: 68 U/L (ref 39–117)
Bilirubin, Direct: 0.1 mg/dL (ref 0.0–0.3)
Total Bilirubin: 0.4 mg/dL (ref 0.2–1.2)
Total Protein: 7.6 g/dL (ref 6.0–8.3)

## 2018-03-01 MED ORDER — PREDNISONE 10 MG PO TABS: ORAL_TABLET | ORAL | 1 refills | Status: DC

## 2018-03-01 MED ORDER — BENZONATATE 200 MG PO CAPS: 200.0000 mg | ORAL_CAPSULE | Freq: Three times a day (TID) | ORAL | 3 refills | Status: DC | PRN

## 2018-03-01 NOTE — Assessment & Plan Note (Signed)
Pulmonary Sarcoidosis , CT chest with adenopathy and nodularity.  Cytology from bronchoscopy positive for granulomas consistent with sarcoid. Patient will need PFTs on return. Patient is having some decrease in shortness of breath since starting steroids. We will decreased prednisone slowly over the next couple weeks and hold at 20 mg. LFTs are pending. Patient education on steroids and sarcoidosis. May also have some skin involvement.  Also has some visual symptoms will refer to ophthalmology. Needs flu shot  Plan  Patient Instructions  Refer to Ophthamology (Sarcoid )  Decrease Prednisone 30mg  daily for 1 week then 20mg  daily -hold at this dose .  Labs today .  Eat healthy diet , low sweet and salt .  Flu shot today .  Follow up with Dr. Lake Bells in 4-6 weeks with PFT and As needed   Please contact office for sooner follow up if symptoms do not improve or worsen or seek emergency care

## 2018-03-01 NOTE — Patient Instructions (Addendum)
Refer to Ophthamology (Sarcoid )  Decrease Prednisone 30mg  daily for 1 week then 20mg  daily -hold at this dose .  Labs today .  Eat healthy diet , low sweet and salt .  Flu shot today .  Follow up with Dr. Lake Bells in 4-6 weeks with PFT and As needed   Please contact office for sooner follow up if symptoms do not improve or worsen or seek emergency care

## 2018-03-01 NOTE — Progress Notes (Signed)
_0  ID: Phil Dopp, male    DOB: [birth year], 33 y.o.   MRN: [id number]  Chief Complaint  Patient presents with  . Follow-up    Sarcoid     Referring provider: No ref. provider found  HPI: 33 year old male former smoker seen for pulmonary consult during January 2020 hospitalization.  Patient presented with a dry cough and shortness of breath CT chest was negative for PE however showed lymphadenopathy ,peribronchial soft tissue thickening requiring EBUS.. Ctyology c/w Sarcoid (granulomas)   TEST/EVENTS :  CTA Chest 02/11/18 >> neg for PE, adenopathy, peribronchial soft tissue thickening, multifocal mass like consolidations, small perihilar and subpleural nodules ACE 1/27 >> 98 ESR 1/27 >> 27 HIV neg  AFB neg smear  BAL Neg  Fungal neg   03/01/2018 Follow up ; Sarcoid  Patient presents for a post hospital follow-up.  Patient was admitted last month after presenting with chest pain shortness of breath.  A CT chest showed no PE but significant lymphadenopathy and pulmonary nodules.  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  ACE level was elevated at 98.  Calcium was normal.  Liver function test were not done during his hospitalization.  2D echo was normal with EF  60-65%, Patient was started on high-dose prednisone.  Discharged on prednisone 40 mg daily  Since discharge patient feels some better . Dyspnea is better. Still has dry cough . Taking Tussionex for cough .  Feels the prednisone is making him feel fatigued .  Pt education on steroids  And Sarcoid .  Does have some visual issue for several months. Small dark lesion on right side .   Works at [employer].   Going to establish with community health and wellness   No Known Allergies  Immunization History  Administered Date(s) Administered  . Influenza Split 11/01/2011  . Influenza,inj,Quad PF,6+ Mos 03/01/2018  . PPD Test 08/24/2010  . Tdap 04/25/2011, 03/26/2017     Past Medical History:  Diagnosis Date  . Asthma   . 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 Junius Roads): helped but too expensive   Meds: Percocet/Vicodin (helped), OTC (ibuprofen)   . COMMON MIGRAINE 04/21/2009   Qualifier: Diagnosis of  By: Jeannine Kitten MD, Rodman Key    . INSOMNIA 09/14/2007   Qualifier: Diagnosis of  By: Zebedee Iba NP, Manuela Schwartz    . S/P ACL repair 2016   R knee    Tobacco History: Social History   Tobacco Use  Smoking Status Former Smoker  . Packs/day: 0.50  . Years: 4.00  . Pack years: 2.00  . Types: Cigars, Cigarettes  . Last attempt to quit: 09/27/2014  . Years since quitting: 3.4  Smokeless Tobacco Never Used   Counseling given: Not Answered   Outpatient Medications Prior to Visit  Medication Sig Dispense Refill  . albuterol (PROVENTIL HFA;VENTOLIN HFA) 108 (90 Base) MCG/ACT inhaler Inhale 2 puffs into the lungs every 6 (six) hours as needed for wheezing. 1 Inhaler 1  . aspirin-acetaminophen-caffeine (EXCEDRIN MIGRAINE) 250-250-65 MG tablet Take 1 tablet by mouth 3 (three) times daily as needed for headache or migraine.    . chlorpheniramine-HYDROcodone (TUSSIONEX) 10-8 MG/5ML SUER Take 5 mLs by mouth every 12 (twelve) hours as needed for cough. 140 mL 0  . predniSONE (DELTASONE) 20 MG tablet Take 2 tablets (40 mg  total) by mouth daily with breakfast for 30 days. 60 tablet 0   No facility-administered medications prior to visit.      Review of Systems:   Constitutional:   No  weight loss, night sweats,  Fevers, chills, + fatigue, or  lassitude.  HEENT:   No headaches,  Difficulty swallowing,  Tooth/dental problems, or  Sore throat,                No sneezing, itching, ear ache, nasal congestion, post nasal drip,   CV:  No chest pain,  Orthopnea, PND, swelling in lower extremities, anasarca, dizziness, palpitations, syncope.    GI  No heartburn, indigestion, abdominal pain, nausea, vomiting, diarrhea, change in bowel habits, loss of appetite, bloody stools.   Resp:   No chest wall deformity  Skin: no rash or lesions.  GU: no dysuria, change in color of urine, no urgency or frequency.  No flank pain, no hematuria   MS:  No joint pain or swelling.  No decreased range of motion.  No back pain.    Physical Exam  BP 116/78 (BP Location: Left Arm, Cuff Size: Normal)   Pulse 88   Ht _0  (1.778 m)   Wt 180 lb 3.2 oz (81.7 kg)   SpO2 98%   BMI 25.86 kg/m   GEN: A/Ox3; pleasant , NAD, well nourished    HEENT:  [place]iolet/AT,  EACs-clear, TMs-wnl, NOSE-clear, THROAT-clear, no lesions, no postnasal drip or exudate noted.   NECK:  Supple w/ fair ROM; no JVD; normal carotid impulses w/o bruits; no thyromegaly or nodules palpated; no lymphadenopathy.    RESP  Clear  P & A; w/o, wheezes/ rales/ or rhonchi. no accessory muscle use, no dullness to percussion  CARD:  RRR, no m/r/g, no peripheral edema, pulses intact, no cyanosis or clubbing.  GI:   Soft & nt; nml bowel sounds; no organomegaly or masses detected.   Musco: Warm bil, no deformities or joint swelling noted.   Neuro: alert, no focal deficits noted.    Skin: Warm, along the right lateral side small nodular lesion that is hyperpigmented.     Lab Results:  CBC    Component Value Date/Time   WBC 4.5 02/12/2018 0324   RBC 4.51 02/12/2018 0324   HGB 12.2 (L) 02/12/2018 0324   HCT 38.3 (L) 02/12/2018 0324   PLT 264 02/12/2018 0324   MCV 84.9 02/12/2018 0324   MCH 27.1 02/12/2018 0324   MCHC 31.9 02/12/2018 0324   RDW 12.5 02/12/2018 0324   LYMPHSABS 1.2 02/11/2018 1712   MONOABS 0.8 02/11/2018 1712   EOSABS 0.2 02/11/2018 1712   BASOSABS 0.1 02/11/2018 1712    BMET    Component Value Date/Time   NA 136 02/12/2018 0324   K 3.7 02/12/2018 0324   CL 99 02/12/2018 0324   CO2 26 02/12/2018 0324   GLUCOSE 118 (H) 02/12/2018 0324   BUN 14  02/12/2018 0324   CREATININE 1.17 02/12/2018 0324   CALCIUM 9.4 02/12/2018 0324   GFRNONAA >60 02/12/2018 0324   GFRAA >60 02/12/2018 0324    BNP No results found for: BNP  ProBNP No results found for: PROBNP  Imaging: Dg Chest 2 View  Result Date: 02/11/2018 CLINICAL DATA:  Chest pain and shortness of breath EXAM: CHEST - 2 VIEW COMPARISON:  04/01/2017 FINDINGS: Cardiac shadow is within normal limits. Lungs are well aerated bilaterally. No focal infiltrate or sizable effusion is seen. No acute bony abnormality is noted. IMPRESSION: No  active cardiopulmonary disease. Electronically Signed   By: Inez Catalina M.D.   On: 02/11/2018 17:14   Ct Angio Chest Pe W And/or Wo Contrast  Result Date: 02/11/2018 CLINICAL DATA:  Shortness of breath, chest pressure EXAM: CT ANGIOGRAPHY CHEST WITH CONTRAST TECHNIQUE: Multidetector CT imaging of the chest was performed using the standard protocol during bolus administration of intravenous contrast. Multiplanar CT image reconstructions and MIPs were obtained to evaluate the vascular anatomy. CONTRAST:  147m ISOVUE-370 IOPAMIDOL (ISOVUE-370) INJECTION 76% COMPARISON:  Chest x-ray 02/11/2018, 04/01/2017 FINDINGS: Cardiovascular: Satisfactory opacification of the pulmonary arteries to the segmental level. No evidence of pulmonary embolism. Normal heart size. No pericardial effusion. Nonaneurysmal aorta. Mediastinum/Nodes: Midline trachea. No thyroid mass. Esophagus normal. Confluent adenopathy or soft tissue thickening at the mainstem bronchi and subcarinal regions bilaterally. This is confluent with peribronchovascular soft tissue thickening. Enlarged bilateral hilar lymph nodes. Lungs/Pleura: Nodular consolidation at the left base measuring 2.1 cm, series 7, image number 64. Nodular consolidation within the apical segment of the right lower lobe measuring 17 mm, series 7, image number 38. Multiple additional small subpleural and perihilar nodules. No pleural  effusion. Upper Abdomen: No acute abnormality. Musculoskeletal: No chest wall abnormality. No acute or significant osseous findings. Review of the MIP images confirms the above findings. IMPRESSION: 1. Negative for acute pulmonary embolus. 2. Confluent adenopathy and or soft tissue thickening at the bilateral mainstem bronchi and subcarinal regions with bilateral hilar adenopathy noted. Peribronchial soft tissue thickening with multifocal masslike consolidations within the apical right lower lobe and the left lung base as described above. Additional small perihilar and subpleural nodules. Differential considerations include multifocal infection with reactive adenopathy, lymphoproliferative disease/lymphoma, and granulomatous disease such as sarcoidosis. Pulmonary consultation is suggested. Electronically Signed   By: KDonavan FoilM.D.   On: 02/11/2018 19:39   Dg Chest Port 1 View  Result Date: 02/13/2018 CLINICAL DATA:  Increased cough and chest pain following bronchoscopy EXAM: PORTABLE CHEST 1 VIEW COMPARISON:  02/11/2018 FINDINGS: Cardiac shadows within normal limits. Mild fullness is noted consistent with the known history of hilar and mediastinal adenopathy. The overall inspiratory effort is poor. No pneumothorax is noted. Mild left basilar atelectasis is noted. No bony abnormality is noted. IMPRESSION: Poor inspiratory effort with mild left basilar atelectasis. Electronically Signed   By: MInez CatalinaM.D.   On: 02/13/2018 16:50      No flowsheet data found.  No results found for: NITRICOXIDE      Assessment & Plan:   Sarcoidosis Pulmonary Sarcoidosis , CT chest with adenopathy and nodularity.  Cytology from bronchoscopy positive for granulomas consistent with sarcoid. Patient will need PFTs on return. Patient is having some decrease in shortness of breath since starting steroids. We will decreased prednisone slowly over the next couple weeks and hold at 20 mg. LFTs are  pending. Patient education on steroids and sarcoidosis. May also have some skin involvement.  Also has some visual symptoms will refer to ophthalmology. Needs flu shot  Plan  Patient Instructions  Refer to Ophthamology (Sarcoid )  Decrease Prednisone 324mdaily for 1 week then 2041maily -hold at this dose .  Labs today .  Eat healthy diet , low sweet and salt .  Flu shot today .  Follow up with Dr. McQLake Bells 4-6 weeks with PFT and As needed   Please contact office for sooner follow up if symptoms do not improve or worsen or seek emergency care  Rexene Edison, NP 03/01/2018

## 2018-03-02 ENCOUNTER — Telehealth: Payer: Self-pay | Admitting: Pulmonary Disease

## 2018-03-02 NOTE — Telephone Encounter (Signed)
Notes recorded by Melvenia Needles, NP on 03/02/2018 at 1:17 PM EST LFT minimally elevated  Will continue to follow going forward.   Pt is aware of results and voiced his understanding. Nothing further is needed.

## 2018-03-05 NOTE — Progress Notes (Signed)
Reviewed, agree 

## 2018-03-14 ENCOUNTER — Ambulatory Visit: Payer: Self-pay | Attending: Critical Care Medicine | Admitting: Critical Care Medicine

## 2018-03-14 ENCOUNTER — Encounter: Payer: Self-pay | Admitting: Critical Care Medicine

## 2018-03-14 VITALS — BP 142/98 | HR 80 | Temp 98.5°F | Resp 16 | Ht 70.0 in | Wt 186.6 lb

## 2018-03-14 DIAGNOSIS — I10 Essential (primary) hypertension: Secondary | ICD-10-CM

## 2018-03-14 DIAGNOSIS — D869 Sarcoidosis, unspecified: Secondary | ICD-10-CM

## 2018-03-14 DIAGNOSIS — R739 Hyperglycemia, unspecified: Secondary | ICD-10-CM

## 2018-03-14 LAB — GLUCOSE, POCT (MANUAL RESULT ENTRY): POC Glucose: 131 mg/dl — AB (ref 70–99)

## 2018-03-14 MED ORDER — METFORMIN HCL 500 MG PO TABS: 1000.0000 mg | ORAL_TABLET | Freq: Every day | ORAL | 1 refills | Status: DC

## 2018-03-14 MED ORDER — HYDROCOD POLST-CPM POLST ER 10-8 MG/5ML PO SUER: 5.0000 mL | Freq: Two times a day (BID) | ORAL | 0 refills | Status: DC | PRN

## 2018-03-14 MED ORDER — AMLODIPINE BESYLATE 10 MG PO TABS: 10.0000 mg | ORAL_TABLET | Freq: Every day | ORAL | 3 refills | Status: DC

## 2018-03-14 NOTE — Assessment & Plan Note (Signed)
Patient has associated hyperglycemia from the prednisone  Will begin metformin 500 mg daily and the patient will continue to monitor the blood sugar

## 2018-03-14 NOTE — Progress Notes (Signed)
Pt states he is also having neck pain

## 2018-03-14 NOTE — Progress Notes (Signed)
Subjective:    Patient ID: Jeremiah Mason, male    DOB: August 17, 1985, 33 y.o.   MRN: [id number]  33 y.o.M with hx of sarcoidosis.   This patient presented to [facility] on 11 February 2018 with increased dyspnea and hypoxemia.  CT angios showed no pulmonary embolism but showed bilateral hilar adenopathy and peripheral lung nodules along with consolidative changes in both lower lobes.  Bronchoscopy was performed and revealed granulomas consistent with sarcoidosis.  Special stains and cultures were negative.  The patient was sent home on high-dose prednisone.  His primary complaint was dyspnea and chest discomfort.  On the prednisone his dyspnea has improved significantly however he is still having some degree of chest pain.  He does have a history of reactive exercise to asthma as well.  He is on as needed albuterol.  Patient does have issues with adverse reactions to prednisone including sweats and anxiousness.  Patient also was aching all over on the prednisone.  He has received Tussionex for cough suppression and this has improved the cough.  Patient also has history of hypertension has not been on blood pressure medicines recently.  There was a need to follow-up liver functions which was done a week ago and showed mild elevations.  Patient has established follow-up with pulmonary medicine.  Patient has pulmonary function studies pending. Note an echocardiogram was done which was unremarkable.  There is no complaints of skin lesions except for mild folliculitis at the onset of prednisone use. There are visual changes noted.  He has a pending appointment with ophthalmology.  .     Past Medical History:  Diagnosis Date  . Asthma   . 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 Junius Roads): helped but too expensive   Meds: Percocet/Vicodin (helped), OTC  (ibuprofen)   . COMMON MIGRAINE 04/21/2009   Qualifier: Diagnosis of  By: Jeannine Kitten MD, Rodman Key    . INSOMNIA 09/14/2007   Qualifier: Diagnosis of  By: Zebedee Iba NP, Manuela Schwartz    . S/P ACL repair 2016   R knee     Family History  Problem Relation Age of Onset  . Diabetes Mother   . Hypertension Mother      Social History   Socioeconomic History  . Marital status: Single    Spouse name: Not on file  . Number of children: Not on file  . Years of education: Not on file  . Highest education level: Not on file  Occupational History    Comment: works a Visual merchandiser  . Financial resource strain: Not on file  . Food insecurity:    Worry: Not on file    Inability: Not on file  . Transportation needs:    Medical: Not on file    Non-medical: Not on file  Tobacco Use  . Smoking status: Former Smoker    Packs/day: 0.50    Years: 4.00    Pack years: 2.00    Types: Cigars, Cigarettes    Last attempt to quit: 09/27/2014    Years since quitting: 3.4  . Smokeless tobacco: Never Used  Substance and Sexual Activity  . Alcohol use: Yes    Comment: very rare  . Drug use: No  . Sexual activity: Yes  Lifestyle  . Physical activity:    Days per week:  Not on file    Minutes per session: Not on file  . Stress: Not on file  Relationships  . Social connections:    Talks on phone: Not on file    Gets together: Not on file    Attends religious service: Not on file    Active member of club or organization: Not on file    Attends meetings of clubs or organizations: Not on file    Relationship status: Not on file  . Intimate partner violence:    Fear of current or ex partner: Not on file    Emotionally abused: Not on file    Physically abused: Not on file    Forced sexual activity: Not on file  Other Topics Concern  . Not on file  Social History Narrative   Exercise regularly 2x a week, plays basketball     No Known Allergies   Outpatient Medications Prior to Visit  Medication Sig  Dispense Refill  . albuterol (PROVENTIL HFA;VENTOLIN HFA) 108 (90 Base) MCG/ACT inhaler Inhale 2 puffs into the lungs every 6 (six) hours as needed for wheezing. 1 Inhaler 1  . aspirin-acetaminophen-caffeine (EXCEDRIN MIGRAINE) 250-250-65 MG tablet Take 1 tablet by mouth 3 (three) times daily as needed for headache or migraine.    . predniSONE (DELTASONE) 10 MG tablet Decrease Prednisone 30mg  daily for 1 week then 20mg  daily -hold at this dose . 150 tablet 1  . benzonatate (TESSALON) 200 MG capsule Take 1 capsule (200 mg total) by mouth 3 (three) times daily as needed for cough. 45 capsule 3  . chlorpheniramine-HYDROcodone (TUSSIONEX) 10-8 MG/5ML SUER Take 5 mLs by mouth every 12 (twelve) hours as needed for cough. (Patient not taking: Reported on 03/14/2018) 140 mL 0   No facility-administered medications prior to visit.      Review of Systems  Constitutional: Positive for activity change, diaphoresis and fatigue.  HENT: Negative.   Eyes: Positive for visual disturbance.  Respiratory: Positive for shortness of breath.   Cardiovascular: Positive for chest pain. Negative for leg swelling.  Gastrointestinal: Negative.   Endocrine: Negative.   Genitourinary: Negative.   Musculoskeletal: Positive for arthralgias.  Skin: Positive for rash.  Allergic/Immunologic: Negative.   Neurological: Negative.   Hematological: Negative.   Psychiatric/Behavioral: Negative.        Objective:   Physical Exam Vitals:   03/14/18 1007  BP: (!) 142/98  Pulse: 80  Resp: 16  Temp: 98.5 F (36.9 C)  TempSrc: Oral  SpO2: 98%  Weight: 186 lb 9.6 oz (84.6 kg)  Height: 5\' 10"  (1.778 m)    Gen: Pleasant, well-nourished, in no distress,  normal affect  ENT: No lesions,  mouth clear,  oropharynx clear, no postnasal drip  Neck: No JVD, no TMG, no carotid bruits  Lungs: No use of accessory muscles, no dullness to percussion, clear without rales or rhonchi, distant BS.  Cardiovascular: RRR, heart sounds  normal, no murmur or gallops, no peripheral edema  Abdomen: soft and NT, no HSM,  BS normal  Musculoskeletal: No deformities, no cyanosis or clubbing  Neuro: alert, non focal  Skin: Warm, follicular inflammtion  CT Angio 01/2018: IMPRESSION: 1. Negative for acute pulmonary embolus. 2. Confluent adenopathy and or soft tissue thickening at the bilateral mainstem bronchi and subcarinal regions with bilateral hilar adenopathy noted. Peribronchial soft tissue thickening with multifocal masslike consolidations within the apical right lower lobe and the left lung base as described above. Additional small perihilar and subpleural nodules. Differential considerations include multifocal  infection with reactive adenopathy, lymphoproliferative disease/lymphoma, and granulomatous disease such as sarcoidosis. Pulmonary consultation is suggested.       Assessment & Plan:  I personally reviewed all images and lab data in the [facility] system as well as any outside material available during this office visit and agree with the  radiology impressions.   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  There is hilar adenopathy and parenchymal involvement in the lungs.  Plan will be to follow-up with pulmonary medicine to include pulmonary function studies and also continue prednisone at 20 mg daily  The patient is not tolerating prednisone well and may need a steroid sparing treatment  We will refill the Tussionex    Hyperglycemia Patient has associated hyperglycemia from the prednisone  Will begin metformin 500 mg daily and the patient will continue to monitor the blood sugar  HTN (hypertension) Essential hypertension which is not well controlled but is been documented previously over the past 4 years  We will follow-up with thyroid function study  We will begin amlodipine 10 mg daily   Pankaj was seen today for hospitalization  follow-up.  Diagnoses and all orders for this visit:  Sarcoidosis  Essential hypertension -     TSH  Hyperglycemia -     POCT glucose (manual entry)  Other orders -     Discontinue: chlorpheniramine-HYDROcodone (TUSSIONEX) 10-8 MG/5ML SUER; Take 5 mLs by mouth every 12 (twelve) hours as needed for cough. -     Discontinue: amLODipine (NORVASC) 10 MG tablet; Take 1 tablet (10 mg total) by mouth daily. -     chlorpheniramine-HYDROcodone ([place]) 10-8 MG/5ML SUER; Take 5 mLs by mouth every 12 (twelve) hours as needed for cough. -     amLODipine (NORVASC) 10 MG tablet; Take 1 tablet (10 mg total) by mouth daily. -     metFORMIN (GLUCOPHAGE) 500 MG tablet; Take 2 tablets (1,000 mg total) by mouth daily with breakfast.

## 2018-03-14 NOTE — Patient Instructions (Addendum)
Continue taking prednisone as prescribed by pulmonary medicine Keep your follow-up appointments with Dr. Lake Bells Begin amlodipine 1 tablet daily for elevated blood pressure, this was sent to your [place] Start metformin 500mg  daily with food for the blood sugar  We will obtain a thyroid test today Refills on the Tussionex cough syrup was sent to your pharmacy Please apply for financial assistance  A primary care follow-up visit will be established at this clinic  See below education on high blood pressure including diet   Hypertension Hypertension, commonly called high blood pressure, is when the force of blood pumping through the arteries is too strong. The arteries are the blood vessels that carry blood from the heart throughout the body. Hypertension forces the heart to work harder to pump blood and may cause arteries to become narrow or stiff. Having untreated or uncontrolled hypertension can cause heart attacks, strokes, kidney disease, and other problems. A blood pressure reading consists of a higher number over a lower number. Ideally, your blood pressure should be below 120/80. The first ("top") number is called the systolic pressure. It is a measure of the pressure in your arteries as your heart beats. The second ("bottom") number is called the diastolic pressure. It is a measure of the pressure in your arteries as the heart relaxes. What are the causes? The cause of this condition is not known. What increases the risk? Some risk factors for high blood pressure are under your control. Others are not. Factors you can change  Smoking.  Having type 2 diabetes mellitus, high cholesterol, or both.  Not getting enough exercise or physical activity.  Being overweight.  Having too much fat, sugar, calories, or salt (sodium) in your diet.  Drinking too much alcohol. Factors that are difficult or impossible to change  Having chronic kidney disease.  Having a family  history of high blood pressure.  Age. Risk increases with age.  Race. You may be at higher risk if you are African-American.  Gender. Men are at higher risk than women before age 44. After age 6, women are at higher risk than men.  Having obstructive sleep apnea.  Stress. What are the signs or symptoms? Extremely high blood pressure (hypertensive crisis) may cause:  Headache.  Anxiety.  Shortness of breath.  Nosebleed.  Nausea and vomiting.  Severe chest pain.  Jerky movements you cannot control (seizures). How is this diagnosed? This condition is diagnosed by measuring your blood pressure while you are seated, with your arm resting on a surface. The cuff of the blood pressure monitor will be placed directly against the skin of your upper arm at the level of your heart. It should be measured at least twice using the same arm. Certain conditions can cause a difference in blood pressure between your right and left arms. Certain factors can cause blood pressure readings to be lower or higher than normal (elevated) for a short period of time:  When your blood pressure is higher when you are in a health care provider's office than when you are at home, this is called white coat hypertension. Most people with this condition do not need medicines.  When your blood pressure is higher at home than when you are in a health care provider's office, this is called masked hypertension. Most people with this condition may need medicines to control blood pressure. If you have a high blood pressure reading during one visit or you have normal blood pressure with other risk factors:  You may be asked to return on a different day to have your blood pressure checked again.  You may be asked to monitor your blood pressure at home for 1 week or longer. If you are diagnosed with hypertension, you may have other blood or imaging tests to help your health care provider understand your overall risk for  other conditions. How is this treated? This condition is treated by making healthy lifestyle changes, such as eating healthy foods, exercising more, and reducing your alcohol intake. Your health care provider may prescribe medicine if lifestyle changes are not enough to get your blood pressure under control, and if:  Your systolic blood pressure is above 130.  Your diastolic blood pressure is above 80. Your personal target blood pressure may vary depending on your medical conditions, your age, and other factors. Follow these instructions at home: Eating and drinking   Eat a diet that is high in fiber and potassium, and low in sodium, added sugar, and fat. An example eating plan is called the DASH (Dietary Approaches to Stop Hypertension) diet. To eat this way: ? Eat plenty of fresh fruits and vegetables. Try to fill half of your plate at each meal with fruits and vegetables. ? Eat whole grains, such as whole wheat pasta, brown rice, or whole grain bread. Fill about one quarter of your plate with whole grains. ? Eat or drink low-fat dairy products, such as skim milk or low-fat yogurt. ? Avoid fatty cuts of meat, processed or cured meats, and poultry with skin. Fill about one quarter of your plate with lean proteins, such as fish, chicken without skin, beans, eggs, and tofu. ? Avoid premade and processed foods. These tend to be higher in sodium, added sugar, and fat.  Reduce your daily sodium intake. Most people with hypertension should eat less than 1,500 mg of sodium a day.  Limit alcohol intake to no more than 1 drink a day for nonpregnant women and 2 drinks a day for men. One drink equals 12 oz of beer, 5 oz of wine, or 1 oz of hard liquor. Lifestyle   Work with your health care provider to maintain a healthy body weight or to lose weight. Ask what an ideal weight is for you.  Get at least 30 minutes of exercise that causes your heart to beat faster (aerobic exercise) most days of the  week. Activities may include walking, swimming, or biking.  Include exercise to strengthen your muscles (resistance exercise), such as pilates or lifting weights, as part of your weekly exercise routine. Try to do these types of exercises for 30 minutes at least 3 days a week.  Do not use any products that contain nicotine or tobacco, such as cigarettes and e-cigarettes. If you need help quitting, ask your health care provider.  Monitor your blood pressure at home as told by your health care provider.  Keep all follow-up visits as told by your health care provider. This is important. Medicines  Take over-the-counter and prescription medicines only as told by your health care provider. Follow directions carefully. Blood pressure medicines must be taken as prescribed.  Do not skip doses of blood pressure medicine. Doing this puts you at risk for problems and can make the medicine less effective.  Ask your health care provider about side effects or reactions to medicines that you should watch for. Contact a health care provider if:  You think you are having a reaction to a medicine you are taking.  You have  headaches that keep coming back (recurring).  You feel dizzy.  You have swelling in your ankles.  You have trouble with your vision. Get help right away if:  You develop a severe headache or confusion.  You have unusual weakness or numbness.  You feel faint.  You have severe pain in your chest or abdomen.  You vomit repeatedly.  You have trouble breathing. Summary  Hypertension is when the force of blood pumping through your arteries is too strong. If this condition is not controlled, it may put you at risk for serious complications.  Your personal target blood pressure may vary depending on your medical conditions, your age, and other factors. For most people, a normal blood pressure is less than 120/80.  Hypertension is treated with lifestyle changes, medicines, or a  combination of both. Lifestyle changes include weight loss, eating a healthy, low-sodium diet, exercising more, and limiting alcohol. This information is not intended to replace advice given to you by your health care provider. Make sure you discuss any questions you have with your health care provider. Document Released: 01/03/2005 Document Revised: 12/02/2015 Document Reviewed: 12/02/2015 Elsevier Interactive Patient Education  2019 [employer].

## 2018-03-14 NOTE — Assessment & Plan Note (Addendum)
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  There is hilar adenopathy and parenchymal involvement in the lungs.  Plan will be to follow-up with pulmonary medicine to include pulmonary function studies and also continue prednisone at 20 mg daily  The patient is not tolerating prednisone well and may need a steroid sparing treatment  We will refill the Tussionex

## 2018-03-14 NOTE — Assessment & Plan Note (Signed)
Essential hypertension which is not well controlled but is been documented previously over the past 4 years  We will follow-up with thyroid function study  We will begin amlodipine 10 mg daily

## 2018-03-15 LAB — TSH: TSH: 1.17 u[IU]/mL (ref 0.450–4.500)

## 2018-03-15 LAB — FUNGAL ORGANISM REFLEX

## 2018-03-15 LAB — FUNGUS CULTURE RESULT

## 2018-03-15 LAB — FUNGUS CULTURE WITH STAIN

## 2018-03-16 ENCOUNTER — Telehealth: Payer: Self-pay

## 2018-03-16 NOTE — Telephone Encounter (Signed)
Contacted pt to go over lab results pt didn't answer left a detailed vm informing pt of results and if he has any questions or concerns to give me a call  

## 2018-03-29 LAB — ACID FAST CULTURE WITH REFLEXED SENSITIVITIES (MYCOBACTERIA)

## 2018-03-29 LAB — ACID FAST CULTURE WITH REFLEXED SENSITIVITIES: ACID FAST CULTURE - AFSCU3: NEGATIVE

## 2018-03-30 ENCOUNTER — Other Ambulatory Visit: Payer: Self-pay

## 2018-03-30 ENCOUNTER — Other Ambulatory Visit: Payer: Self-pay | Admitting: Adult Health

## 2018-03-30 ENCOUNTER — Ambulatory Visit (INDEPENDENT_AMBULATORY_CARE_PROVIDER_SITE_OTHER): Payer: Self-pay | Admitting: Adult Health

## 2018-03-30 ENCOUNTER — Ambulatory Visit (INDEPENDENT_AMBULATORY_CARE_PROVIDER_SITE_OTHER): Payer: Self-pay

## 2018-03-30 ENCOUNTER — Ambulatory Visit (INDEPENDENT_AMBULATORY_CARE_PROVIDER_SITE_OTHER): Payer: Self-pay | Admitting: Pulmonary Disease

## 2018-03-30 ENCOUNTER — Encounter: Payer: Self-pay | Admitting: Adult Health

## 2018-03-30 DIAGNOSIS — R059 Cough, unspecified: Secondary | ICD-10-CM

## 2018-03-30 DIAGNOSIS — D869 Sarcoidosis, unspecified: Secondary | ICD-10-CM

## 2018-03-30 DIAGNOSIS — R05 Cough: Secondary | ICD-10-CM

## 2018-03-30 LAB — PULMONARY FUNCTION TEST
DL/VA % pred: 135 %
DL/VA: 6.58 ml/min/mmHg/L
DLCO cor % pred: 99 %
DLCO cor: 32.39 ml/min/mmHg
DLCO unc % pred: 92 %
DLCO unc: 29.96 ml/min/mmHg
FEF 25-75 Post: 2.99 L/sec
FEF 25-75 Pre: 3.01 L/sec
FEF2575-%CHANGE-POST: 0 %
FEF2575-%Pred-Post: 72 %
FEF2575-%Pred-Pre: 73 %
FEV1-%CHANGE-POST: 0 %
FEV1-%Pred-Post: 85 %
FEV1-%Pred-Pre: 85 %
FEV1-Post: 3.24 L
FEV1-Pre: 3.23 L
FEV1FVC-%Change-Post: 2 %
FEV1FVC-%Pred-Pre: 95 %
FEV6-%Change-Post: -2 %
FEV6-%Pred-Post: 88 %
FEV6-%Pred-Pre: 90 %
FEV6-PRE: 4.03 L
FEV6-Post: 3.95 L
FEV6FVC-%Change-Post: 0 %
FEV6FVC-%PRED-PRE: 101 %
FEV6FVC-%Pred-Post: 101 %
FVC-%CHANGE-POST: -2 %
FVC-%Pred-Post: 87 %
FVC-%Pred-Pre: 89 %
FVC-Post: 3.95 L
FVC-Pre: 4.03 L
Post FEV1/FVC ratio: 82 %
Post FEV6/FVC ratio: 100 %
Pre FEV1/FVC ratio: 80 %
Pre FEV6/FVC Ratio: 100 %
RV % pred: 48 %
RV: 0.8 L
TLC % pred: 68 %
TLC: 4.73 L

## 2018-03-30 MED ORDER — CHLORPHENIRAMINE MALEATE 2 MG/5ML PO SYRP: 4.0000 mg | ORAL_SOLUTION | ORAL | 0 refills | Status: DC | PRN

## 2018-03-30 NOTE — Assessment & Plan Note (Signed)
Appears stable  PFT normal .  No changes

## 2018-03-30 NOTE — Assessment & Plan Note (Signed)
Cough suspect is due to Sarcoid  Continue on steroids -slow taper .  Cough control regimen  Add Chlortrimeton for sinus drainage trigger   Plan  Patient Instructions  Establish with Ophthamology (Sarcoid )  Decrease Prednisone 10mg  daily for 2 week then 5mg  daily for 2 weeks and stop .   Chlortrimeton 4mg  every 6hr as needed for drainage or throat clearing /coughing .  Chest xray today .  Eat healthy diet , low sweet and salt .  CT chest w/ contrast in 4 weeks .  Follow up with Dr. Lake Bells or Rahmir Beever NP  in 4-weeks  Please contact office for sooner follow up if symptoms do not improve or worsen or seek emergency care

## 2018-03-30 NOTE — Patient Instructions (Addendum)
Establish with Ophthamology (Sarcoid )  Decrease Prednisone 10mg  daily for 2 week then 5mg  daily for 2 weeks and stop .   Chlortrimeton 4mg  every 6hr as needed for drainage or throat clearing /coughing .  Chest xray today .  Eat healthy diet , low sweet and salt .  CT chest w/ contrast in 4 weeks .  Follow up with Dr. Lake Bells or Alannie Amodio NP  in 4-weeks  Please contact office for sooner follow up if symptoms do not improve or worsen or seek emergency care

## 2018-03-30 NOTE — Progress Notes (Signed)
_0  ID: Jeremiah Mason, male    DOB: 18-Oct-1985, 33 y.o.   MRN: [id number]  Chief Complaint  Patient presents with  . Follow-up    Sarcoid     Referring provider: No ref. provider found  HPI: 33 year old male former smoker seen for pulmonary consult during January 2020 hospitalization.  Patient presented with a dry cough and shortness of breath CT chest was negative for PE however showed lymphadenopathy ,peribronchial soft tissue thickening requiring EBUS.. Ctyology c/w Sarcoid (granulomas)  - No insurance - [employer] .   TEST/EVENTS :  CTA Chest 02/11/18 >> neg for PE, adenopathy, peribronchial soft tissue thickening, multifocal mass like consolidations, small perihilar and subpleural nodules ACE 1/27 >> 98 ESR 1/27 >> 27 HIV neg  AFB neg smear  BAL Neg  Fungal neg    03/30/2018 Follow up : Sarcoid , PFT results  Patient returns for a one-month follow-up.  He was admitted in January with chest pain or shortness of breath.  CT chest was negative for PE, shows significant lymphadenopathy and pulmonary nodules.  Patient underwent a bronchoscopy with cytology positive for granuloma likely consistent with pulmonary sarcoidosis.  Cultures were negative.  ACE level was elevated at 98.  Calcium was normal.  2D echo was normal with an EF at 60-65%.  Patient was started on steroids.  Patient was seen in the office last month.  His shortness of breath had improved and dry cough persisted.  Liver function test showed a minimally elevated ALT at 56. Patient was recommend to decrease his prednisone slowly down to 20 mg and hold. He was referred to ophthalmology for routine screening. Patient returns today and feeling some better. Chest tightness has decreased. Dyspnea is decreased. Cough continues , no significant change. Mainly dry . Sore from coughing .  Using delsym and tessalon . No sinus drainage or GERD .  Has fatigue and muscle/joint aches .  Pulmonary Function testing  today shows, FEV1 85%, ratio 82 FVC 87%,  no significant bronchodilator response  DLCO 92%  No Known Allergies  Immunization History  Administered Date(s) Administered  . Influenza Split 11/01/2011, 10/29/2017  . Influenza,inj,Quad PF,6+ Mos 03/01/2018  . PPD Test 08/24/2010  . Tdap 04/25/2011, 03/26/2017    Past Medical History:  Diagnosis Date  . Asthma   . 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 Junius Roads): helped but too expensive   Meds: Percocet/Vicodin (helped), OTC (ibuprofen)   . COMMON MIGRAINE 04/21/2009   Qualifier: Diagnosis of  By: Jeannine Kitten MD, Rodman Key    . INSOMNIA 09/14/2007   Qualifier: Diagnosis of  By: Zebedee Iba NP, Manuela Schwartz    . S/P ACL repair 2016   R knee    Tobacco History: Social History   Tobacco Use  Smoking Status Former Smoker  . Packs/day: 0.50  . Years: 4.00  . Pack years: 2.00  . Types: Cigars, Cigarettes  . Last attempt to quit: 09/27/2014  . Years since quitting: 3.5  Smokeless Tobacco Never Used   Counseling given: Not Answered   Outpatient Medications Prior to Visit  Medication Sig Dispense Refill  . albuterol (PROVENTIL HFA;VENTOLIN HFA) 108 (90 Base) MCG/ACT inhaler Inhale 2 puffs into the lungs every 6 (six) hours as needed for wheezing. 1 Inhaler 1  . amLODipine (NORVASC) 10 MG tablet Take 1  tablet (10 mg total) by mouth daily. 90 tablet 3  . aspirin-acetaminophen-caffeine (EXCEDRIN MIGRAINE) 250-250-65 MG tablet Take 1 tablet by mouth 3 (three) times daily as needed for headache or migraine.    . metFORMIN (GLUCOPHAGE) 500 MG tablet Take 2 tablets (1,000 mg total) by mouth daily with breakfast. 30 tablet 1  . predniSONE (DELTASONE) 10 MG tablet Decrease Prednisone 55m daily for 1 week then 222mdaily -hold at this dose . 150 tablet 1  . chlorpheniramine-HYDROcodone (TUSSIONEX) 10-8 MG/5ML SUER Take 5 mLs by  mouth every 12 (twelve) hours as needed for cough. (Patient not taking: Reported on 03/30/2018) 140 mL 0   No facility-administered medications prior to visit.      Review of Systems:   Constitutional:   No  weight loss, night sweats,  Fevers, chills, + fatigue, or  lassitude.  HEENT:   No headaches,  Difficulty swallowing,  Tooth/dental problems, or  Sore throat,                No sneezing, itching, ear ache, nasal congestion, post nasal drip,   CV:  No chest pain,  Orthopnea, PND, swelling in lower extremities, anasarca, dizziness, palpitations, syncope.   GI  No heartburn, indigestion, abdominal pain, nausea, vomiting, diarrhea, change in bowel habits, loss of appetite, bloody stools.   Resp:    No chest wall deformity  Skin: no rash or lesions.  GU: no dysuria, change in color of urine, no urgency or frequency.  No flank pain, no hematuria   MS:  +joint and muscle aches     Physical Exam  BP 128/84 (BP Location: Left Arm, Cuff Size: Normal)   Pulse 90   Ht _0  (1.778 m)   Wt 192 lb (87.1 kg)   SpO2 99%   BMI 27.55 kg/m   GEN: A/Ox3; pleasant , NAD    HEENT:  [place]/AT,  EACs-clear, TMs-wnl, NOSE-clear, THROAT-clear, no lesions, no postnasal drip or exudate noted.   NECK:  Supple w/ fair ROM; no JVD; normal carotid impulses w/o bruits; no thyromegaly or nodules palpated; no lymphadenopathy.    RESP  Clear  P & A; w/o, wheezes/ rales/ or rhonchi. no accessory muscle use, no dullness to percussion  CARD:  RRR, no m/r/g, no peripheral edema, pulses intact, no cyanosis or clubbing.  GI:   Soft & nt; nml bowel sounds; no organomegaly or masses detected.   Musco: Warm bil, no deformities or joint swelling noted.   Neuro: alert, no focal deficits noted.    Skin: Warm, no lesions or rashes    Lab Results:  CBC    Component Value Date/Time   WBC 4.5 02/12/2018 0324   RBC 4.51 02/12/2018 0324   HGB 12.2 (L) 02/12/2018 0324   HCT 38.3 (L) 02/12/2018 0324   PLT  264 02/12/2018 0324   MCV 84.9 02/12/2018 0324   MCH 27.1 02/12/2018 0324   MCHC 31.9 02/12/2018 0324   RDW 12.5 02/12/2018 0324   LYMPHSABS 1.2 02/11/2018 1712   MONOABS 0.8 02/11/2018 1712   EOSABS 0.2 02/11/2018 1712   BASOSABS 0.1 02/11/2018 1712    BMET    Component Value Date/Time   NA 136 02/12/2018 0324   K 3.7 02/12/2018 0324   CL 99 02/12/2018 0324   CO2 26 02/12/2018 0324   GLUCOSE 118 (H) 02/12/2018 0324   BUN 14 02/12/2018 0324   CREATININE 1.17 02/12/2018 0324   CALCIUM 9.4 02/12/2018 [id number] 02/12/2018 0324  GFRAA >60 02/12/2018 0324    BNP No results found for: BNP  ProBNP No results found for: PROBNP  Imaging: No results found.    PFT Results Latest Ref Rng & Units 03/30/2018  FVC-Pre L 4.03  FVC-Predicted Pre % 89  FVC-Post L 3.95  FVC-Predicted Post % 87  Pre FEV1/FVC % % 80  Post FEV1/FCV % % 82  FEV1-Pre L 3.23  FEV1-Predicted Pre % 85  FEV1-Post L 3.24  DLCO UNC% % 92  DLCO COR %Predicted % 135  TLC L 4.73  TLC % Predicted % 68  RV % Predicted % 48    No results found for: NITRICOXIDE      Assessment & Plan:   Sarcoidosis Clinically is improving with residual cough . PFT shows normal lung function, normal DLCO  Will check chest xray today .  Will slowly decrease steroids .  follow up CT chest in 4 weeks   Plan  Patient Instructions  Establish with Ophthamology (Sarcoid )  Decrease Prednisone 61m daily for 2 week then 56mdaily for 2 weeks and stop .   Chlortrimeton 4m43mvery 6hr as needed for drainage or throat clearing /coughing .  Chest xray today .  Eat healthy diet , low sweet and salt .  CT chest w/ contrast in 4 weeks .  Follow up with Dr. McQLake Bells Parrett NP  in 4-weeks  Please contact office for sooner follow up if symptoms do not improve or worsen or seek emergency care        Exercise-induced asthma Appears stable  PFT normal .  No changes   Cough Cough suspect is due to Sarcoid   Continue on steroids -slow taper .  Cough control regimen  Add Chlortrimeton for sinus drainage trigger   Plan  Patient Instructions  Establish with Ophthamology (Sarcoid )  Decrease Prednisone 55m25mily for 2 week then 5mg 23mly for 2 weeks and stop .   Chlortrimeton 4mg e75my 6hr as needed for drainage or throat clearing /coughing .  Chest xray today .  Eat healthy diet , low sweet and salt .  CT chest w/ contrast in 4 weeks .  Follow up with Dr. McQuaiLake Bellsrrett NP  in 4-weeks  Please contact office for sooner follow up if symptoms do not improve or worsen or seek emergency care           Tammy Rexene Edison/13/2020

## 2018-03-30 NOTE — Progress Notes (Signed)
Reviewed, agree 

## 2018-03-30 NOTE — Progress Notes (Signed)
Full PFT performed today. °

## 2018-03-30 NOTE — Assessment & Plan Note (Addendum)
Clinically is improving with residual cough . PFT shows normal lung function, normal DLCO  Will check chest xray today .  Will slowly decrease steroids .  follow up CT chest in 4 weeks   Plan  Patient Instructions  Establish with Ophthamology (Sarcoid )  Decrease Prednisone 10mg  daily for 2 week then 5mg  daily for 2 weeks and stop .   Chlortrimeton 4mg  every 6hr as needed for drainage or throat clearing /coughing .  Chest xray today .  Eat healthy diet , low sweet and salt .  CT chest w/ contrast in 4 weeks .  Follow up with Dr. Lake Bells or Mearle Drew NP  in 4-weeks  Please contact office for sooner follow up if symptoms do not improve or worsen or seek emergency care

## 2018-04-12 ENCOUNTER — Telehealth: Payer: Self-pay | Admitting: Adult Health

## 2018-04-12 NOTE — Telephone Encounter (Signed)
Pt wants to know if he is to stop or continue with Prednisone. Directions at last ov was to stop after being on 5mg  for 2 weeks. He was to then get CT scan. Now with COVID guideline CT rescheduled to 5/26. Please advise on Prednisone therapy.

## 2018-04-12 NOTE — Telephone Encounter (Signed)
He is to stop prednisone as recommended from last ov  Continue to monitor for symptoms.  If he begins to have more symptoms with cough or respiratory issues to call our office back immediately.  We will continue to go by guidelines for follow-up CT chest.  Please contact office for sooner follow up if symptoms do not improve or worsen or seek emergency care    Cont w/ COVID precautions

## 2018-04-12 NOTE — Telephone Encounter (Signed)
Pt notified. Nothing further needed

## 2018-04-21 ENCOUNTER — Telehealth: Payer: Self-pay | Admitting: Family Medicine

## 2018-04-21 NOTE — Telephone Encounter (Signed)
**  After Hours/ Emergency Line Call**  Received a call to report that Jeremiah Mason reporting constant migraines with vision changes and shortness of breath.  She reports that for the past week his blood pressure has been elevated.  Blood pressures are normally around 136/106 or higher.  Patient reports that with blood pressure elevations he is having constant migraines on the right side of his head.  Does report that his vision has been going in and out and is blurry.  Has not been able to see a physician because no outpatient doctors are seeing patients right now.  Has been compliant on amlodipine 10 mg for the past 3 weeks.  Has had some chest pain and shortness of breath, this is constant because he is sarcoidosis.  Mother reports that this is somewhat not true as his shortness of breath is worsened recently.  States that for the past couple days he can even talk and it seems like he cannot catch his breath during a normal conversation.  Given these concerns that patient is having vision go in and out all and also not able to have a conversation without being short of breath I recommended he go to emergency department.  Her mother very concerned about going to the emergency department but I discussed that with history of sarcoidosis and not being able to catch his breath he may be having an exacerbation at this time.  Recommended that a physician see him and fortunately the clinic is not open until Monday.  Will likely need imaging and stress dose steroids.  Red flag symptoms discussed.  Will forward to PCP who patient reports is Dr. Higinio Plan.  Caroline More, DO PGY-2, [facility] 04/21/2018 10:31 PM

## 2018-04-23 ENCOUNTER — Other Ambulatory Visit: Payer: Self-pay

## 2018-04-24 NOTE — Telephone Encounter (Signed)
Call patient to follow-up on increasing shortness of breath and migraines over the weekend.  No answer, left voicemail.  Hopeful that his symptoms improved as I do not note any ED visit in epic, and additionally reassured he has an appointment with [facility] tomorrow, 4/8.  If he does call back and continues to have chest pain and shortness of breath without being able to hold a normal conversation, he should present to the ED.  Will need to clarify in the future if Jeremiah Mason would like to continue at [facility] or within our clinic for his primary care needs to ensure appropriate continuity and care.

## 2018-04-24 NOTE — Telephone Encounter (Signed)
Spoke with pt see where he has on appt tomorrow. Pt states he is doing better. Salvatore Marvel, CMA

## 2018-04-24 NOTE — Telephone Encounter (Signed)
Attempted to call pt. LVM stating note left by Dr. Higinio Plan, asked pt to give [place] a call back. Salvatore Marvel, CMA

## 2018-04-25 ENCOUNTER — Other Ambulatory Visit: Payer: Self-pay

## 2018-04-25 ENCOUNTER — Ambulatory Visit: Payer: Self-pay | Admitting: Family Medicine

## 2018-04-26 ENCOUNTER — Ambulatory Visit: Payer: Self-pay | Admitting: Family Medicine

## 2018-04-30 ENCOUNTER — Ambulatory Visit: Payer: Self-pay | Admitting: Adult Health

## 2018-05-10 ENCOUNTER — Telehealth: Payer: Self-pay | Admitting: General Practice

## 2018-05-10 NOTE — Telephone Encounter (Signed)
Please have the patient call the pulmonologist for this refill. Typically this is a medication only used for short periods of time but given his condition they may feel comfortable giving him a refill, it is at the discretion of he doctor that saw him there.

## 2018-05-10 NOTE — Telephone Encounter (Signed)
Called pt verified DOB  Informed pt that he would have to contact his pulmonologist for the refill per note Pt did not understand he stated Dr.Wright prescribed it last since his 1st pulmonologist over at the hospital referred him to over

## 2018-05-10 NOTE — Telephone Encounter (Signed)
1) Medication(s) Requested (by name): chlorpheniramine-HYDROcodone ([place])  2) Pharmacy of Choice: [facility] [phone] - [place], [place] 3) Special Requests:   Approved medications will be sent to the pharmacy, we will reach out if there is an issue.  Requests made after 3pm may not be addressed until the following business day!  If a patient is unsure of the name of the medication(s) please note and ask patient to call back when they are able to provide all info, do not send to responsible party until all information is available!

## 2018-05-10 NOTE — Telephone Encounter (Signed)
He saw Tammy Parrett with [facility] on 03/01/18 and 03/30/18 and he has an appt scheduled with her for 06/15/18. He was sent here to see DR. Joya Gaskins but he should establish care with a PCP here for primary care to take care of other conditions like his BP. The medication he is wanting refilled he has used for his cough and chest pain associated with his lung condition so it should come from the specialist.

## 2018-05-10 NOTE — Telephone Encounter (Signed)
Called pt verified DOB informed pt  Pt understood

## 2018-06-08 ENCOUNTER — Telehealth: Payer: Self-pay | Admitting: *Deleted

## 2018-06-08 NOTE — Telephone Encounter (Signed)

## 2018-06-12 ENCOUNTER — Other Ambulatory Visit: Payer: Self-pay

## 2018-06-12 ENCOUNTER — Other Ambulatory Visit (INDEPENDENT_AMBULATORY_CARE_PROVIDER_SITE_OTHER): Payer: Self-pay

## 2018-06-12 ENCOUNTER — Ambulatory Visit (INDEPENDENT_AMBULATORY_CARE_PROVIDER_SITE_OTHER)
Admission: RE | Admit: 2018-06-12 | Discharge: 2018-06-12 | Disposition: A | Discharge status: Home / Self Care | Payer: Self-pay | Source: Ambulatory Visit | Attending: Adult Health | Admitting: Adult Health

## 2018-06-12 DIAGNOSIS — D869 Sarcoidosis, unspecified: Secondary | ICD-10-CM

## 2018-06-12 LAB — BASIC METABOLIC PANEL
BUN: 15 mg/dL (ref 6–23)
CO2: 29 mEq/L (ref 19–32)
Calcium: 9.6 mg/dL (ref 8.4–10.5)
Chloride: 104 mEq/L (ref 96–112)
Creatinine, Ser: 1.11 mg/dL (ref 0.40–1.50)
GFR: 92.74 mL/min (ref >60.00)
Glucose, Bld: 105 mg/dL — ABNORMAL HIGH (ref 70–99)
Potassium: 4.2 mEq/L (ref 3.5–5.1)
Sodium: 139 mEq/L (ref 135–145)

## 2018-06-12 MED ORDER — IOHEXOL 300 MG/ML  SOLN
80.0000 mL | Freq: Once | INTRAMUSCULAR | Status: AC | PRN
  Administered 2018-06-12: 80 mL via INTRAVENOUS

## 2018-06-14 ENCOUNTER — Encounter: Payer: Self-pay | Admitting: Adult Health

## 2018-06-14 ENCOUNTER — Ambulatory Visit (INDEPENDENT_AMBULATORY_CARE_PROVIDER_SITE_OTHER): Payer: Self-pay | Admitting: Adult Health

## 2018-06-14 ENCOUNTER — Other Ambulatory Visit: Payer: Self-pay

## 2018-06-14 DIAGNOSIS — D869 Sarcoidosis, unspecified: Secondary | ICD-10-CM

## 2018-06-14 NOTE — Patient Instructions (Signed)
Establish with Ophthamology (Sarcoid )  Follow up with [facility] for blood pressure control and headaches.  Follow up with Dr. Lake Bells or Carnell Casamento NP  3-4 months and As needed   Please contact office for sooner follow up if symptoms do not improve or worsen or seek emergency care

## 2018-06-14 NOTE — Progress Notes (Signed)
Virtual Visit via Telephone Note  I connected with Jeremiah Mason on 06/14/18 at  2:30 PM EDT by telephone and verified that I am speaking with the correct person using two identifiers.  Location: Patient: Home  Provider: Office    I discussed the limitations, risks, security and privacy concerns of performing an evaluation and management service by telephone and the availability of in person appointments. I also discussed with the patient that there may be a patient responsible charge related to this service. The patient expressed understanding and agreed to proceed.   History of Present Illness: Today's televisit is for follow up Sarcoid and CT chest results   33 year old male former smoker seen for pulmonary consult during January 2020 hospitalization. Patient presented with a dry cough and shortness of breath CT chest was negative for PE however showed lymphadenopathy ,peribronchial soft tissue thickening requiringEBUS.. Ctyology c/w Sarcoid (granulomas) - No insurance - [employer] .   Patient returns for a 2 month follow up . As above found to Sarcoidosis after EBUS cytology consistent with sarcoidosis . CT chest with significant lymphadenopathy and pulmonary nodules.  He was treated with course of prednisone and slowly weaned off over 3-4 months. Finished taper Mid April . CT chest was done 5/2 that showed resolution of pulmonary nodules and decreased lymphadenopathy.  We discussed his test results.  He says he has minimal cough .  Complains of low energy and says he has intermittent headaches. He has HTN is followed at [facility]. No visual changes, chest pain, speech changes or extremity weakness.      Observations/Objective: CTA Chest 02/11/18>>neg for PE, adenopathy, peribronchial soft tissue thickening, multifocal mass like consolidations, small perihilar and subpleural nodules ACE 1/27 >> 98 ESR 1/27 >> 27 HIV neg  AFB neg smear  BAL  Neg  Fungal neg   Assessment and Plan: Sarcoidosis - significant improvement on CT chest after steroid course . Resolution of pulmonary nodules and decreased adenopathy . Remain of steroids  Advance activity as tolerated.  Establish with ophthalmology .  Will need PFT in ~03/2019 (annual follow up)   HTN , Headaches (neg neuro ROS)   Need PCP follow up   Plan  Patient Instructions  Establish with Ophthamology (Sarcoid )  Follow up with [place] for blood pressure control and headaches.  Follow up with Dr. Lake Bells or Oluwadarasimi Redmon NP  3-4 months and As needed   Please contact office for sooner follow up if symptoms do not improve or worsen or seek emergency care        Follow Up Instructions:   Follow up with Dr. Lake Bells in 3-4 months  And  .As needed    I discussed the assessment and treatment plan with the patient. The patient was provided an opportunity to ask questions and all were answered. The patient agreed with the plan and demonstrated an understanding of the instructions.   The patient was advised to call back or seek an in-person evaluation if the symptoms worsen or if the condition fails to improve as anticipated.  I provided 24  minutes of non-face-to-face time during this encounter.   Rexene Edison, NP

## 2018-06-15 ENCOUNTER — Ambulatory Visit: Payer: Self-pay | Admitting: Adult Health

## 2018-06-17 NOTE — Progress Notes (Signed)
Reviewed, agree 

## 2018-11-16 ENCOUNTER — Ambulatory Visit: Payer: Self-pay | Admitting: Family Medicine

## 2019-01-31 ENCOUNTER — Ambulatory Visit (INDEPENDENT_AMBULATORY_CARE_PROVIDER_SITE_OTHER): Payer: Self-pay | Admitting: Adult Health

## 2019-01-31 ENCOUNTER — Ambulatory Visit (INDEPENDENT_AMBULATORY_CARE_PROVIDER_SITE_OTHER): Payer: Self-pay

## 2019-01-31 ENCOUNTER — Encounter: Payer: Self-pay | Admitting: Adult Health

## 2019-01-31 ENCOUNTER — Other Ambulatory Visit: Payer: Self-pay

## 2019-01-31 DIAGNOSIS — R059 Cough, unspecified: Secondary | ICD-10-CM

## 2019-01-31 DIAGNOSIS — R05 Cough: Secondary | ICD-10-CM

## 2019-01-31 DIAGNOSIS — D869 Sarcoidosis, unspecified: Secondary | ICD-10-CM

## 2019-01-31 NOTE — Patient Instructions (Signed)
Chest xray today .  Begin Prednisone 20mg  daily for 2 weeks , then 10mg  daily until seen back .  Establish with Ophthamology (Sarcoid )   Follow up with Dr. Hermina Staggers or Emelie Newsom NP  3- weeks and As needed   Please contact office for sooner follow up if symptoms do not improve or worsen or seek emergency care

## 2019-01-31 NOTE — Addendum Note (Signed)
Addended by: Parke Poisson E on: 01/31/2019 02:17 PM   Modules accepted: Orders

## 2019-01-31 NOTE — Assessment & Plan Note (Signed)
Suspect flare -previously steroid responsive. Will give short burst and close follow up .  Check chest xray today  If improved on return taper slow off. , if not and need prolonged treatment consider steroid sparing agent   Plan  Patient Instructions  Chest xray today .  Begin Prednisone 20mg  daily for 2 weeks , then 10mg  daily until seen back .  Establish with Ophthamology (Sarcoid )   Follow up with Dr. Hermina Staggers or Baylyn Sickles NP  3- weeks and As needed   Please contact office for sooner follow up if symptoms do not improve or worsen or seek emergency care

## 2019-01-31 NOTE — Progress Notes (Signed)
_0  ID: Jeremiah Mason, male    DOB: 21-Aug-1985, 34 y.o.   MRN: [id number]  Chief Complaint  Patient presents with  . Follow-up    Sarcoid     Referring provider: No ref. provider found  HPI: 34 year old male former smoker seen for pulmonary consult January 2020 during hospitalization.  Patient presented with a dry cough shortness of breath.  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  Patient was found to have sarcoidosis diagnosed January 2020.  He presented with shortness of breath and dry cough.  CT chest showed significant adenopathy and peribronchial soft tissue thickening with multifocal masslike consolidations and small perihilar and subpleural nodules.  ACE level was elevated at 98. He underwent a bronchoscopy Bal and cultures were negative.  AFB and fungal were negative.  Cytology was positive for granulomatous. Patient was treated for suspected sarcoidosis.  Had significant improvement on steroids for around 3 to 4 months and weaned off mid April 2020.  CT chest was repeated May 19, 2018.  And showed resolution of pulmonary nodules and decreased lymphadenopathy. Since last visit patient says doing well until 2 weeks ago, started having dry cough , tightness , painful cough and dyspnea . Says this is how he started out when he got diagnosed with Sarcoid.  No fever, no loss of taste or smell. No known sick contacts.  No hemoptysis , no rash. No  hemoptysis.    No Known Allergies  Immunization History  Administered Date(s) Administered  . Influenza Split 11/01/2011, 10/29/2017  . Influenza,inj,Quad PF,6+ Mos 03/01/2018  . PPD Test 08/24/2010  . Tdap 04/25/2011, 03/26/2017    Past Medical History:  Diagnosis Date  . Asthma   . 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 HJunius Roads: helped but too expensive   Meds: Percocet/Vicodin (helped), OTC (ibuprofen)   . COMMON MIGRAINE 04/21/2009   Qualifier: Diagnosis of  By: OJeannine KittenMD, MRodman Key   . INSOMNIA 09/14/2007   Qualifier: Diagnosis of  By: SZebedee IbaNP, SManuela Schwartz   . S/P ACL repair 2016   R knee    Tobacco History: Social History   Tobacco Use  Smoking Status Former Smoker  . Packs/day: 0.50  . Years: 4.00  . Pack years: 2.00  . Types: Cigars, Cigarettes  . Quit date: 09/27/2014  . Years since quitting: 4.3  Smokeless Tobacco Never Used   Counseling given: Not Answered   Outpatient Medications Prior to Visit  Medication Sig Dispense Refill  . albuterol (PROVENTIL HFA;VENTOLIN HFA) 108 (90 Base) MCG/ACT inhaler Inhale 2 puffs into the lungs every 6 (six) hours as needed for wheezing. 1 Inhaler 1  . amLODipine (NORVASC) 10 MG tablet Take 1 tablet (10 mg total) by mouth daily. 90 tablet 3  . aspirin-acetaminophen-caffeine (EXCEDRIN MIGRAINE) 250-250-65 MG tablet Take 1 tablet by mouth  3 (three) times daily as needed for headache or migraine.    . metFORMIN (GLUCOPHAGE) 500 MG tablet Take 2 tablets (1,000 mg total) by mouth daily with breakfast. 30 tablet 1  . predniSONE (DELTASONE) 10 MG tablet Decrease Prednisone 73m daily for 1 week then 228mdaily -hold at this dose . (Patient not taking: Reported on 06/14/2018) 150 tablet 1   No facility-administered medications prior to visit.     Review of Systems:   Constitutional:   No  weight  loss, night sweats,  Fevers, chills, fatigue, or  lassitude.  HEENT:   No headaches,  Difficulty swallowing,  Tooth/dental problems, or  Sore throat,                No sneezing, itching, ear ache, nasal congestion, post nasal drip,   CV:  No chest pain,  Orthopnea, PND, swelling in lower extremities, anasarca, dizziness, palpitations, syncope.   GI  No heartburn, indigestion, abdominal pain, nausea, vomiting, diarrhea, change in bowel habits, loss of appetite, bloody stools.   Resp:    No chest wall deformity  Skin: no rash or lesions.  GU: no dysuria, change in color of urine, no urgency or frequency.  No flank pain, no hematuria   MS:  No joint pain or swelling.  No decreased range of motion.  No back pain.    Physical Exam  BP 126/82 (BP Location: Left Arm, Cuff Size: Normal)   Pulse 81   Temp 98.2 F (36.8 C) (Temporal)   Ht _0  (1.778 m)   Wt 201 lb 12.8 oz (91.5 kg)   SpO2 100% Comment: RA  BMI 28.96 kg/m   GEN: A/Ox3; pleasant , NAD, well nourished    HEENT:  [place]/AT,   NOSE-clear, THROAT-clear, no lesions, no postnasal drip or exudate noted.   NECK:  Supple w/ fair ROM; no JVD; normal carotid impulses w/o bruits; no thyromegaly or nodules palpated; no lymphadenopathy.    RESP  Clear  P & A; w/o, wheezes/ rales/ or rhonchi. no accessory muscle use, no dullness to percussion  CARD:  RRR, no m/r/g, no peripheral edema, pulses intact, no cyanosis or clubbing.  GI:   Soft & nt; nml bowel sounds; no organomegaly or masses detected.   Musco: Warm bil, no deformities or joint swelling noted.   Neuro: alert, no focal deficits noted.    Skin: Warm, no lesions or rashes    Lab Results:  CBC   BNP No results found for: BNP  ProBNP No results found for: PROBNP  Imaging: No results found.    PFT Results Latest Ref Rng & Units 03/30/2018  FVC-Pre L 4.03  FVC-Predicted Pre % 89  FVC-Post L 3.95  FVC-Predicted Post % 87  Pre FEV1/FVC % % 80  Post  FEV1/FCV % % 82  FEV1-Pre L 3.23  FEV1-Predicted Pre % 85  FEV1-Post L 3.24  DLCO UNC% % 92  DLCO COR %Predicted % 135  TLC L 4.73  TLC % Predicted % 68  RV % Predicted % 48    No results found for: NITRICOXIDE      Assessment & Plan:   Sarcoidosis Suspect flare -previously steroid responsive. Will give short burst and close follow up .  Check chest xray today  If improved on return taper slow off. , if not and need prolonged treatment consider steroid sparing agent   Plan  Patient Instructions  Chest xray today .  Begin Prednisone 2074maily for 2 weeks ,  then 75m daily until seen back .  Establish with Ophthamology (Sarcoid )   Follow up with Dr. OHermina Staggersor Shantelle Alles NP  3- weeks and As needed   Please contact office for sooner follow up if symptoms do not improve or worsen or seek emergency care        Cough Delsym As needed  Cough     Total patient care time 32 min  Jeremiah Dulworth, NP 01/31/2019

## 2019-01-31 NOTE — Assessment & Plan Note (Signed)
Delsym As needed  Cough

## 2019-02-01 NOTE — Progress Notes (Signed)
Called spoke with patient, advised of cxr results / recs as stated by Jeremiah Edison NP.  Pt verbalized understanding and denied any questions.  Patient is already scheduled for 3 week follow up on 2/5 and will call the office if the prednisone does not help.  Patient does not need Rx for prednisone as he reports having plenty at home.

## 2019-02-22 ENCOUNTER — Ambulatory Visit (INDEPENDENT_AMBULATORY_CARE_PROVIDER_SITE_OTHER): Payer: 59 | Admitting: Adult Health

## 2019-02-22 ENCOUNTER — Encounter: Payer: Self-pay | Admitting: Adult Health

## 2019-02-22 ENCOUNTER — Other Ambulatory Visit: Payer: Self-pay

## 2019-02-22 VITALS — BP 118/82 | HR 82 | Temp 98.0°F | Ht 70.0 in | Wt 202.6 lb

## 2019-02-22 DIAGNOSIS — J45990 Exercise induced bronchospasm: Secondary | ICD-10-CM | POA: Diagnosis not present

## 2019-02-22 DIAGNOSIS — R0789 Other chest pain: Secondary | ICD-10-CM

## 2019-02-22 DIAGNOSIS — D869 Sarcoidosis, unspecified: Secondary | ICD-10-CM

## 2019-02-22 NOTE — Progress Notes (Signed)
_0  ID: Jeremiah Mason, male    DOB: [birth year], 34 y.o.   MRN: [id number]  Chief Complaint  Patient presents with  . Follow-up    Sarcoid     Referring provider: No ref. provider found  HPI: 34 year old male former smoker seen for pulmonary consult January 2020 during hospitalization.  Patient presented with a dry cough and shortness of breath.  CT chest was negative for PE, showed lymphadenopathy, peribronchial soft tissue thickening requiring an EBUS.  Cytology was consistent with sarcoidosis with granulomatous  [facility]   TEST/EVENTS :  CTA Chest 02/11/18>>neg for PE, adenopathy, peribronchial soft tissue thickening, multifocal mass like consolidations, small perihilar and subpleural nodules  ACE 1/27 >> 98 ESR 1/27 >> 27 HIV neg  AFB neg smear  BAL Neg  Fungal neg   CT chest Jun 12, 2018 resolution of bilateral pulmonary nodules.  Positive response with reduction in hilar and mediastinal adenopathy  02/22/2019 Follow up : Sarcoid  Patient returns for 3 week follow up . Patient has underlying sarcoidosis which was diagnosed in January 2020.  Initial presentation with shortness of breath and dry cough.  Initial CT chest showed significant adenopathy and multifocal masslike consolidations along with subpleural nodules.  He had an elevated ACE level at 98.  He was treated with oral steroids for about 3 to 4 months.  And was weaned off mid April 2020.  Follow-up CT chest May 2020 showed resolution of pulmonary nodules and decreased lymphadenopathy.  Patient was seen last visit with return of dry cough chest tightness and shortness of breath.  Patient was given a prednisone taper over the last 2 weeks. Chest x-ray was clear with no adenopathy noted.  He says he is feeling better.  Has been Working on being active , riding bike 23mn a day  Has been off prednisone for 1 week . Feels cough got better but not totally gone. Dry cough.  Has ongoing chest tightness. Dyspnea is better but not resolved.      No Known Allergies  Immunization History  Administered Date(s) Administered  . Influenza Split 11/01/2011, 10/29/2017  . Influenza,inj,Quad PF,6+ Mos 03/01/2018  . PPD Test 08/24/2010  . Tdap 04/25/2011, 03/26/2017    Past Medical History:  Diagnosis Date  . Asthma   . 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 HJunius Roads: helped but too expensive   Meds: Percocet/Vicodin (helped), OTC (ibuprofen)   . COMMON MIGRAINE 04/21/2009   Qualifier: Diagnosis of  By: OJeannine KittenMD, MRodman Key   . INSOMNIA 09/14/2007   Qualifier: Diagnosis of  By: SZebedee IbaNP, SManuela Schwartz   . S/P ACL repair 2016   R knee    Tobacco History: Social History   Tobacco Use  Smoking Status Former Smoker  . Packs/day: 0.50  . Years: 4.00  . Pack years: 2.00  . Types: Cigars, Cigarettes  . Quit date: 09/27/2014  . Years since quitting: 4.4  Smokeless Tobacco Never Used   Counseling given: Not Answered   Outpatient Medications Prior to Visit  Medication Sig Dispense Refill  . albuterol (PROVENTIL HFA;VENTOLIN HFA) 108 (90 Base) MCG/ACT inhaler Inhale 2 puffs into the lungs every 6 (six) hours as needed for wheezing. 1 Inhaler 1  . amLODipine (NORVASC) 10 MG tablet Take 1 tablet (  10 mg total) by mouth daily. 90 tablet 3  . aspirin-acetaminophen-caffeine (EXCEDRIN MIGRAINE) 250-250-65 MG tablet Take 1 tablet by mouth 3 (three) times daily as needed for headache or migraine.    . metFORMIN (GLUCOPHAGE) 500 MG tablet Take 2 tablets (1,000 mg total) by mouth daily with breakfast. 30 tablet 1   No facility-administered medications prior to visit.     Review of Systems:   Constitutional:   No  weight loss, night sweats,  Fevers, chills, fatigue, or  lassitude.  HEENT:   No headaches,  Difficulty swallowing,   Tooth/dental problems, or  Sore throat,                No sneezing, itching, ear ache, nasal congestion, post nasal drip,   CV:  No chest pain,  Orthopnea, PND, swelling in lower extremities, anasarca, dizziness, palpitations, syncope.   GI  No heartburn, indigestion, abdominal pain, nausea, vomiting, diarrhea, change in bowel habits, loss of appetite, bloody stools.   Resp: No shortness of breath with exertion or at rest.  No excess mucus, no productive cough,  No non-productive cough,  No coughing up of blood.  No change in color of mucus.  No wheezing.  No chest wall deformity  Skin: no rash or lesions.  GU: no dysuria, change in color of urine, no urgency or frequency.  No flank pain, no hematuria   MS:  No joint pain or swelling.  No decreased range of motion.  No back pain.    Physical Exam  BP 118/82 (BP Location: Left Arm, Cuff Size: Normal)   Pulse 82   Temp 98 F (36.7 C) (Temporal)   Ht _0  (1.778 m)   Wt 202 lb 9.6 oz (91.9 kg)   SpO2 99% Comment: RA  BMI 29.07 kg/m   GEN: A/Ox3; pleasant , NAD, well nourished    HEENT:  [place]/AT,  EACs-clear, TMs-wnl, NOSE-clear, THROAT-clear, no lesions, no postnasal drip or exudate noted.   NECK:  Supple w/ fair ROM; no JVD; normal carotid impulses w/o bruits; no thyromegaly or nodules palpated; no lymphadenopathy.    RESP  Clear  P & A; w/o, wheezes/ rales/ or rhonchi. no accessory muscle use, no dullness to percussion  CARD:  RRR, no m/r/g, no peripheral edema, pulses intact, no cyanosis or clubbing.  GI:   Soft & nt; nml bowel sounds; no organomegaly or masses detected.   Musco: Warm bil, no deformities or joint swelling noted.   Neuro: alert, no focal deficits noted.    Skin: Warm, no lesions or rashes    Lab Results:  CBC  BNP No results found for: BNP  ProBNP No results found for: PROBNP  Imaging: DG Chest 2 View  Result Date: 01/31/2019 CLINICAL DATA:  History of sarcoidosis.  Cough. EXAM: CHEST - 2  VIEW COMPARISON:  March 30, 2018 FINDINGS: Lungs are clear. Heart size and pulmonary vascularity are normal. No adenopathy. No bone lesions. IMPRESSION: Lungs clear.  No adenopathy.  Stable appearance from prior study. Electronically Signed   By: Lowella Grip III M.D.   On: 01/31/2019 14:36      PFT Results Latest Ref Rng & Units 03/30/2018  FVC-Pre L 4.03  FVC-Predicted Pre % 89  FVC-Post L 3.95  FVC-Predicted Post % 87  Pre FEV1/FVC % % 80  Post FEV1/FCV % % 82  FEV1-Pre L 3.23  FEV1-Predicted Pre % 85  FEV1-Post L 3.24  DLCO UNC% % 92  DLCO COR %Predicted %  135  TLC L 4.73  TLC % Predicted % 68  RV % Predicted % 48    No results found for: NITRICOXIDE        Assessment & Plan:   No problem-specific Assessment & Plan notes found for this encounter.     Rexene Edison, NP 02/22/2019

## 2019-02-22 NOTE — Patient Instructions (Addendum)
Activity as tolerated  Refer to Cardiology  Labs today .  Establish with Ophthamology (Sarcoid )  Follow up with Dr. Hermina Staggers or Gabrielle Wakeland NP  3- months with PFT-Spirometry with DLCO . and As needed   Please contact office for sooner follow up if symptoms do not improve or worsen or seek emergency care

## 2019-02-24 LAB — ANGIOTENSIN CONVERTING ENZYME: Angiotensin-Converting Enzyme: 111 U/L — ABNORMAL HIGH (ref 9–67)

## 2019-02-25 DIAGNOSIS — R0789 Other chest pain: Secondary | ICD-10-CM | POA: Insufficient documentation

## 2019-02-25 NOTE — Assessment & Plan Note (Signed)
Ongoing chest tightness questionable etiology.  Patient has underlying sarcoidosis with presenting symptoms of shortness of breath and dry cough.  Symptoms improved on prednisone.  He had initial CT changes with adenopathy, consolidations and subpleural nodules.  This resolved on follow-up CT chest after treatment with steroids.  Chest x-ray last month was clear with no notable adenopathy.. Patient does have family history of heart disease.  Will refer to cardiology for evaluation. 2D echo at sarcoid presentation last year was normal.

## 2019-02-25 NOTE — Assessment & Plan Note (Signed)
Recent flare-chest x-ray was clear with no adenopathy noted.  Patient had clinical improvement however has ongoing chest tightness questionable etiology.  Check labs today with an ACE level. Needs PFTs on return.  Plan Patient Instructions  Activity as tolerated  Refer to Cardiology  Labs today .  Establish with Ophthamology (Sarcoid )  Follow up with Dr. Hermina Staggers or Sten Dematteo NP  3- months with PFT-Spirometry with DLCO . and As needed   Please contact office for sooner follow up if symptoms do not improve or worsen or seek emergency care

## 2019-02-25 NOTE — Assessment & Plan Note (Signed)
May use albuterol as needed.  Check PFTs on return.

## 2019-02-26 NOTE — Progress Notes (Signed)
Cardiology Office Note:   Date:  02/28/2019  NAME:  Jeremiah Mason    MRN: [id number] DOB:  04-04-1985   PCP:  Patient, No Pcp Per  Cardiologist:  No primary care provider on file.   Referring MD: Melvenia Needles, NP   Chief Complaint  Patient presents with  . Chest Pain    History of Present Illness:   Jeremiah Mason is a 34 y.o. male with a hx of pulmonary sarcoidosis who is being seen today for the evaluation of chest pain at the request of Parrett, Fonnie Mu, NP.  Hospitalized in January 2020.  Diagnosed with pulmonary sarcoidosis.  I reviewed his CT scan dated 06/12/2018.  He has no evidence of coronary calcifications.  His most recent echocardiogram from January 2020 shows no wall motion abnormalities, ejection fraction 60 to 65%.  His most recent EKG shows normal sinus rhythm no evidence of conduction disease.  He reports for the last 2 months he has had intermittent chest tightness.  The pain is reported as in the central chest.  It is reported as a burning sensation.  It is not associated with foods.  It can occur 8-9 times per day.  The pain lasts 1 hour.  He reports no identifiable trigger and no alleviating factor.  He reports pressing on his center of his chest can make the pain a little worse.  I am able to reproduce it on examination today.  He does report an episode of costochondritis when he was a teenager.  He reports similar symptoms and was treated with NSAIDs.  He reports when he rides his bike or does any certain activities this does make the burning worse.  It is a little better with rest.  He also reports that laying down to make the pain worse.  He used to smoke but quit a number of years ago.  No alcohol use reported.  No illicit drug use.  No strong family history of heart disease.  His sarcoidosis been limited to his lungs.  He has no cutaneous or ocular manifestations of this.  His EKG today shows normal sinus rhythm without any evidence of conduction disease.  His most  recent echocardiogram was normal.  He denies any symptoms of lower extreme edema or heart failure symptoms.  Problem List 1. Pulmonary sarcoidosis   Past Medical History: Past Medical History:  Diagnosis Date  . Asthma   . 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 Junius Roads): helped but too expensive   Meds: Percocet/Vicodin (helped), OTC (ibuprofen)   . COMMON MIGRAINE 04/21/2009   Qualifier: Diagnosis of  By: Jeannine Kitten MD, Rodman Key    . Hypertension   . INSOMNIA 09/14/2007   Qualifier: Diagnosis of  By: Zebedee Iba NP, Manuela Schwartz    . S/P ACL repair 2016   R knee  . Sarcoidosis     Past Surgical History: Past Surgical History:  Procedure Laterality Date  . ANTERIOR CRUCIATE LIGAMENT REPAIR Right 2016  . ORIF FINGER FRACTURE Right 2011   5th finger. broken in 3 places-4 pins put in  . VIDEO BRONCHOSCOPY WITH ENDOBRONCHIAL ULTRASOUND N/A 02/13/2018   Procedure: VIDEO BRONCHOSCOPY WITH ENDOBRONCHIAL ULTRASOUND;  Surgeon: Juanito Doom, MD;  Location: [facility] OR;  Service: Thoracic;  Laterality: N/A;    Current Medications: Current Meds  Medication  Sig  . albuterol (PROVENTIL HFA;VENTOLIN HFA) 108 (90 Base) MCG/ACT inhaler Inhale 2 puffs into the lungs every 6 (six) hours as needed for wheezing.  Marland Kitchen amLODipine (NORVASC) 10 MG tablet Take 1 tablet (10 mg total) by mouth daily.  Marland Kitchen aspirin-acetaminophen-caffeine (EXCEDRIN MIGRAINE) 250-250-65 MG tablet Take 1 tablet by mouth 3 (three) times daily as needed for headache or migraine.     Allergies:    Patient has no known allergies.   Social History: Social History   Socioeconomic History  . Marital status: Single    Spouse name: Not on file  . Number of children: 1  . Years of education: Not on file  . Highest education level: Not on file  Occupational History    Comment: works a Forensic scientist  Tobacco Use  .  Smoking status: Former Smoker    Packs/day: 0.50    Years: 4.00    Pack years: 2.00    Types: Cigars, Cigarettes    Quit date: 09/27/2014    Years since quitting: 4.4  . Smokeless tobacco: Never Used  Substance and Sexual Activity  . Alcohol use: Yes    Comment: very rare  . Drug use: No  . Sexual activity: Yes  Other Topics Concern  . Not on file  Social History Narrative   Exercise regularly 2x a week, plays basketball   Social Determinants of Health   Financial Resource Strain:   . Difficulty of Paying Living Expenses: Not on file  Food Insecurity:   . Worried About Charity fundraiser in the Last Year: Not on file  . Ran Out of Food in the Last Year: Not on file  Transportation Needs:   . Lack of Transportation (Medical): Not on file  . Lack of Transportation (Non-Medical): Not on file  Physical Activity:   . Days of Exercise per Week: Not on file  . Minutes of Exercise per Session: Not on file  Stress:   . Feeling of Stress : Not on file  Social Connections:   . Frequency of Communication with Friends and Family: Not on file  . Frequency of Social Gatherings with Friends and Family: Not on file  . Attends Religious Services: Not on file  . Active Member of Clubs or Organizations: Not on file  . Attends Archivist Meetings: Not on file  . Marital Status: Not on file     Family History: The patient's family history includes Diabetes in his mother; Hypertension in his mother.  ROS:   All other ROS reviewed and negative. Pertinent positives noted in the HPI.     EKGs/Labs/Other Studies Reviewed:   The following studies were personally reviewed by me today:  EKG:  EKG is ordered today.  The ekg ordered today demonstrates NSR HR 85, No STT changes concerning for ischemia, no prior infarction, and was personally reviewed by me.   TTE 02/13/2018 1. The left ventricle appears to be normal in size, has normal wall  thickness 60-65% ejection fraction Spectral  Doppler shows normal pattern  of diastolic filling.  2. The right ventricle has normal size and normal systolic function.  3. Normal left atrial size.  4. Normal right atrial size.  5. Mitral valve regurgitation is trivial by color flow Doppler.  6. The mitral valve normal in structure and function.  7. Normal tricuspid valve.  8. Aortic valve probably tricuspid.  9. The aortic root is normal is size and structure.  10. No atrial level shunt detected by  color flow Doppler.   Recent Labs: 03/01/2018: ALT 56 03/14/2018: TSH 1.170 06/12/2018: BUN 15; Creatinine, Ser 1.11; Potassium 4.2; Sodium 139   Recent Lipid Panel No results found for: CHOL, TRIG, HDL, CHOLHDL, VLDL, LDLCALC, LDLDIRECT  Physical Exam:   VS:  BP 132/90   Pulse 85   Ht 5\' 10"  (1.778 m)   Wt 209 lb (94.8 kg)   BMI 29.99 kg/m    Wt Readings from Last 3 Encounters:  02/28/19 209 lb (94.8 kg)  02/22/19 202 lb 9.6 oz (91.9 kg)  01/31/19 201 lb 12.8 oz (91.5 kg)    General: Well nourished, well developed, in no acute distress Heart: Atraumatic, normal size  Eyes: PEERLA, EOMI  Neck: Supple, no JVD Endocrine: No thryomegaly Cardiac: Normal S1, S2; RRR; no murmurs, rubs, or gallops Lungs: Clear to auscultation bilaterally, no wheezing, rhonchi or rales  Abd: Soft, nontender, no hepatomegaly  Ext: No edema, pulses 2+ Musculoskeletal: No deformities, BUE and BLE strength normal and equal, tenderness noted with palpation of the sternum area Skin: Warm and dry, no rashes   Neuro: Alert and oriented to person, place, time, and situation, CNII-XII grossly intact, no focal deficits  Psych: Normal mood and affect   ASSESSMENT:   SHAHEER KINAS is a 34 y.o. male who presents for the following: 1. Chest pain, unspecified type   2. Costochondritis     PLAN:   1. Chest pain, unspecified type 2. Costochondritis -He presents with a burning sensation in his chest intermittently for the past 1 month.  It appears  to be worsened by palpation.  I am able to reproduce some of the pain on examination today.  I highly suspect this is costochondritis.  His EKG is normal and there is no evidence of underlying conduction disease.  I have low suspicion for cardiac involvement of his sarcoidosis at this time.  For now we will treat with a short course of NSAIDs.  I will give him ibuprofen 800 mg 3 times daily for 10 days.  He will remain well-hydrated on this medication.  We will then see him back in about 2 weeks after this to see if his symptoms have resolved. -Regarding his sarcoidosis, this is limited to the pulmonary system.  He has no extra pulmonary manifestations that I am aware of.  He has no conduction disease on his EKG to suggest underlying cardiac sarcoidosis.  He has no syncope or alarming symptoms that require further work-up at this time.  He has no evidence of heart failure or volume overload on examination today.  Overall, I have very low suspicion for cardiac involvement.  We will treat him for costochondritis and see him back to ensure resolution of symptoms.  Disposition: Return in about 2 weeks (around 03/14/2019).  Medication Adjustments/Labs and Tests Ordered: Current medicines are reviewed at length with the patient today.  Concerns regarding medicines are outlined above.  Orders Placed This Encounter  Procedures  . EKG 12-Lead   Meds ordered this encounter  Medications  . ibuprofen (ADVIL) 800 MG tablet    Sig: Take 1 tablet (800 mg total) by mouth 3 (three) times daily.    Dispense:  30 tablet    Refill:  0    Patient Instructions  Medication Instructions:  Start ibuprofen 800 mg for 10 days. *If you need a refill on your cardiac medications before your next appointment, please call your pharmacy*  Follow-Up: At [facility], you and your health needs are our  priority.  As part of our continuing mission to provide you with exceptional heart care, we have created designated Provider  Care Teams.  These Care Teams include your primary Cardiologist (physician) and Advanced Practice Providers (APPs -  Physician Assistants and Nurse Practitioners) who all work together to provide you with the care you need, when you need it.  Your next appointment:   2 week(s)  The format for your next appointment:   In Person  Provider:   Eleonore Chiquito, MD        Signed, Addison Naegeli. Audie Box, [place]  [address], [place], [place] [address] [phone]  02/28/2019 4:24 PM

## 2019-02-28 ENCOUNTER — Ambulatory Visit: Payer: 59 | Admitting: Cardiovascular Disease

## 2019-02-28 ENCOUNTER — Encounter: Payer: Self-pay | Admitting: Cardiovascular Disease

## 2019-02-28 ENCOUNTER — Other Ambulatory Visit: Payer: Self-pay

## 2019-02-28 VITALS — BP 132/90 | HR 85 | Ht 70.0 in | Wt 209.0 lb

## 2019-02-28 DIAGNOSIS — M940 Chondrocostal junction syndrome [Tietze]: Secondary | ICD-10-CM

## 2019-02-28 DIAGNOSIS — R079 Chest pain, unspecified: Secondary | ICD-10-CM | POA: Diagnosis not present

## 2019-02-28 MED ORDER — IBUPROFEN 800 MG PO TABS: 800.0000 mg | ORAL_TABLET | Freq: Three times a day (TID) | ORAL | 0 refills | Status: AC

## 2019-02-28 NOTE — Patient Instructions (Addendum)
Medication Instructions:  Start ibuprofen 800 mg for 10 days. *If you need a refill on your cardiac medications before your next appointment, please call your pharmacy*  Follow-Up: At [facility], you and your health needs are our priority.  As part of our continuing mission to provide you with exceptional heart care, we have created designated Provider Care Teams.  These Care Teams include your primary Cardiologist (physician) and Advanced Practice Providers (APPs -  Physician Assistants and Nurse Practitioners) who all work together to provide you with the care you need, when you need it.  Your next appointment:   2 week(s)  The format for your next appointment:   In Person  Provider:   Eleonore Chiquito, MD

## 2019-03-01 NOTE — Progress Notes (Signed)
LMTCB

## 2019-03-03 IMAGING — CR DG FINGER INDEX 2+V*L*
3 series · 3 of 3 positions shown · non-contrast
Comparison: None.

CLINICAL DATA: Laceration to the index finger

EXAM:
LEFT INDEX FINGER 2+V

[finger ap]
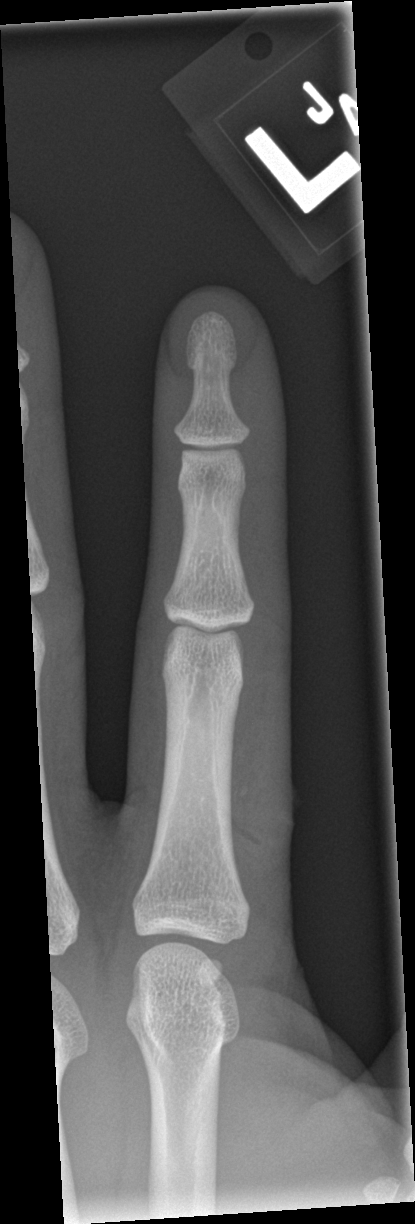

[finger obl]
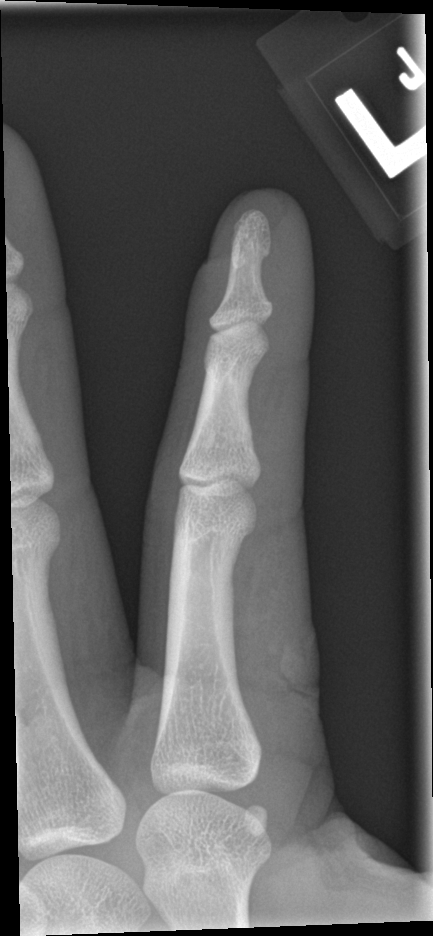

[finger lat]
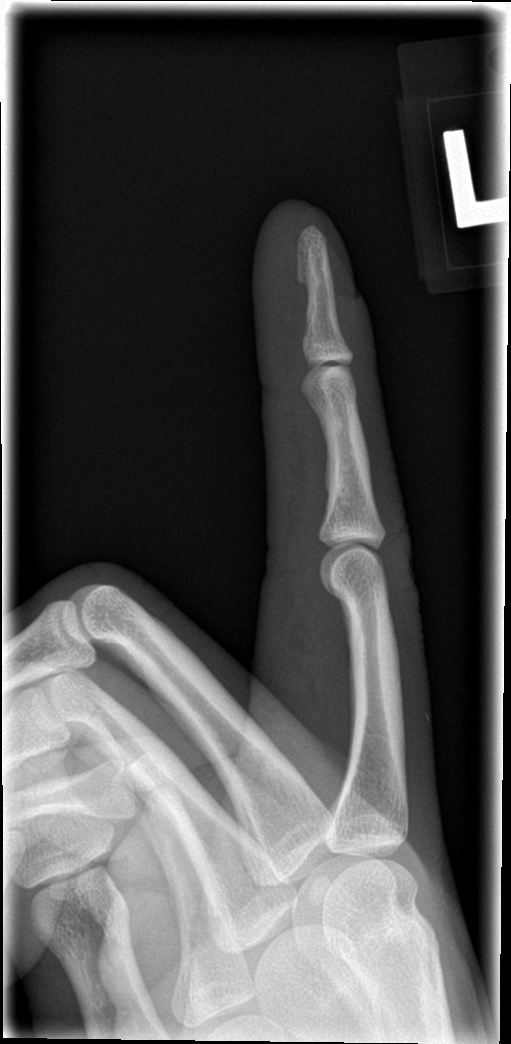

[3 of 3 positions shown; findings below may reference images not displayed]

FINDINGS: There is no evidence of fracture or dislocation. There is no
evidence of arthropathy or other focal bone abnormality. Soft
tissues are unremarkable.
IMPRESSION: Negative.

## 2019-03-15 ENCOUNTER — Ambulatory Visit: Payer: 59 | Admitting: Cardiovascular Disease

## 2019-03-15 NOTE — Progress Notes (Deleted)
Cardiology Office Note:   Date:  03/15/2019  NAME:  Jeremiah Mason    MRN: [id number] DOB:  06-14-1985   PCP:  Patient, No Pcp Per  Cardiologist:  No primary care provider on file.  Electrophysiologist:  None   Referring MD: No ref. provider found   No chief complaint on file. ***  History of Present Illness:   Jeremiah Mason is a 34 y.o. male with a hx of pulmonary sarcoidosis who presents for follow-up of chest pain.  He was evaluated on 02/28/2019 with symptoms consistent with costochondritis.  He was started on a short course of ibuprofen.  He presents for follow-up today.  Problem List 1. Pulmonary sarcoidosis   Past Medical History: Past Medical History:  Diagnosis Date  . Asthma   . 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 Junius Roads): helped but too expensive   Meds: Percocet/Vicodin (helped), OTC (ibuprofen)   . COMMON MIGRAINE 04/21/2009   Qualifier: Diagnosis of  By: Jeannine Kitten MD, Rodman Key    . Hypertension   . INSOMNIA 09/14/2007   Qualifier: Diagnosis of  By: Zebedee Iba NP, Manuela Schwartz    . S/P ACL repair 2016   R knee  . Sarcoidosis     Past Surgical History: Past Surgical History:  Procedure Laterality Date  . ANTERIOR CRUCIATE LIGAMENT REPAIR Right 2016  . ORIF FINGER FRACTURE Right 2011   5th finger. broken in 3 places-4 pins put in  . VIDEO BRONCHOSCOPY WITH ENDOBRONCHIAL ULTRASOUND N/A 02/13/2018   Procedure: VIDEO BRONCHOSCOPY WITH ENDOBRONCHIAL ULTRASOUND;  Surgeon: Juanito Doom, MD;  Location: [facility] OR;  Service: Thoracic;  Laterality: N/A;    Current Medications: No outpatient medications have been marked as taking for the 03/15/19 encounter (Appointment) with Geralynn Rile, MD.     Allergies:    Patient has no known allergies.   Social History: Social History   Socioeconomic History  . Marital status: Single    Spouse name: Not on file  . Number of children: 1  . Years of education: Not on file  . Highest education level: Not on file  Occupational History    Comment: works a Forensic scientist  Tobacco Use  . Smoking status: Former Smoker    Packs/day: 0.50    Years: 4.00    Pack years: 2.00    Types: Cigars, Cigarettes    Quit date: 09/27/2014    Years since quitting: 4.4  . Smokeless tobacco: Never Used  Substance and Sexual Activity  . Alcohol use: Yes    Comment: very rare  . Drug use: No  . Sexual activity: Yes  Other Topics Concern  . Not on file  Social History Narrative   Exercise regularly 2x a week, plays basketball   Social Determinants of Health   Financial Resource Strain:   . Difficulty of Paying Living Expenses: Not on file  Food Insecurity:   . Worried About Charity fundraiser in the Last Year: Not on file  . Ran Out of Food in the Last Year: Not on file  Transportation Needs:   . Lack of Transportation (Medical): Not on file  . Lack of Transportation (Non-Medical): Not on file  Physical Activity:   . Days of Exercise per Week: Not on file  . Minutes of Exercise per Session: Not on file  Stress:   . Feeling of Stress : Not on file  Social Connections:   . Frequency of Communication with Friends and Family: Not on file  . Frequency of Social Gatherings with Friends and Family: Not on file  . Attends Religious Services: Not on file  . Active Member of Clubs or Organizations: Not on file  . Attends Archivist Meetings: Not on file  . Marital Status: Not on file     Family History: The patient's ***family history includes Diabetes in his mother; Hypertension in his mother.  ROS:   All other ROS reviewed and negative. Pertinent positives noted in the HPI.     EKGs/Labs/Other Studies Reviewed:   The following studies were personally reviewed by me today:  EKG:  EKG is *** ordered today.  The ekg ordered today demonstrates ***, and was personally reviewed  by me.   Recent Labs: 06/12/2018: BUN 15; Creatinine, Ser 1.11; Potassium 4.2; Sodium 139   Recent Lipid Panel No results found for: CHOL, TRIG, HDL, CHOLHDL, VLDL, LDLCALC, LDLDIRECT  Physical Exam:   VS:  There were no vitals taken for this visit.   Wt Readings from Last 3 Encounters:  02/28/19 209 lb (94.8 kg)  02/22/19 202 lb 9.6 oz (91.9 kg)  01/31/19 201 lb 12.8 oz (91.5 kg)    General: Well nourished, well developed, in no acute distress Heart: Atraumatic, normal size  Eyes: PEERLA, EOMI  Neck: Supple, no JVD Endocrine: No thryomegaly Cardiac: Normal S1, S2; RRR; no murmurs, rubs, or gallops Lungs: Clear to auscultation bilaterally, no wheezing, rhonchi or rales  Abd: Soft, nontender, no hepatomegaly  Ext: No edema, pulses 2+ Musculoskeletal: No deformities, BUE and BLE strength normal and equal Skin: Warm and dry, no rashes   Neuro: Alert and oriented to person, place, time, and situation, CNII-XII grossly intact, no focal deficits  Psych: Normal mood and affect   ASSESSMENT:   Jeremiah Mason is a 34 y.o. male who presents for the following: No diagnosis found.  PLAN:   There are no diagnoses linked to this encounter.  Disposition: No follow-ups on file.  Medication Adjustments/Labs and Tests Ordered: Current medicines are reviewed at length with the patient today.  Concerns regarding medicines are outlined above.  No orders of the defined types were placed in this encounter.  No orders of the defined types were placed in this encounter.   There are no Patient Instructions on file for this visit.   Signed, Addison Naegeli. Audie Box, [place]  [address] [place], [place] [address] [phone]  03/15/2019 12:04 PM

## 2019-04-08 ENCOUNTER — Encounter: Payer: Self-pay | Admitting: Cardiovascular Disease

## 2019-05-21 ENCOUNTER — Other Ambulatory Visit (HOSPITAL_COMMUNITY): Payer: 59

## 2019-05-24 ENCOUNTER — Ambulatory Visit: Payer: 59 | Admitting: Adult Health

## 2019-07-06 ENCOUNTER — Other Ambulatory Visit (HOSPITAL_COMMUNITY): Payer: 59

## 2019-07-09 ENCOUNTER — Ambulatory Visit: Payer: 59 | Admitting: Adult Health

## 2019-07-12 ENCOUNTER — Ambulatory Visit: Payer: 59 | Admitting: Adult Health

## 2019-10-01 ENCOUNTER — Other Ambulatory Visit: Payer: 59

## 2019-11-11 ENCOUNTER — Telehealth: Payer: Self-pay | Admitting: Adult Health

## 2019-11-11 NOTE — Telephone Encounter (Signed)
Called and spoke with pt letting him know the info stated by TP and he verbalized understanding. Pt has been scheduled for a televisit with TP tomorrow 10/26. Nothing further needed.

## 2019-11-11 NOTE — Telephone Encounter (Signed)
Sorry to hear he is not feeling well , needs evaluation.  Needs to Covid tested . Will need televisit or video visit .  For acute symptoms with chest pain  if not able to get in will need to go to ER /Urgent care    He was last seen in 02/2019 and did not keep f/up or PFT recs Will need ov to follow up as well.   Please contact office for sooner follow up if symptoms do not improve or worsen or seek emergency care

## 2019-11-11 NOTE — Telephone Encounter (Signed)
Called and spoke with pt who states he has been having pain in chest when he breathes in which is worse when he lies down. Pt states it feels like he has swallowed a golf ball as he feels like there is something stuck in his chest.  Pt is also coughing a lot stating that he has been getting up yellow phlegm. Pt has tried delsym for the cough and ibuprofen for the pain but he states neither med have helped.  Pt denied any complaints of fever but stated two days ago he believes he had a temp of 100. Asked pt to check his temp while I had him on the phone but pt stated that he could not find the thermometer and was unsure where his mom put it after it was checked a couple days ago.  Pt stated that he did not feel like he was running a temp. Pt last took ibuprofen around 11am this morning.  Asked pt if he has had his covid vaccines and he stated that he had. Pt stated he had the pfizer vaccine with the first shot 10/05/19 and he believes the second shot was 10/27/19 but he could not find the card that had the date on it.  Pt wants to know what is recommended to help with symptoms. Tammy, please advise.

## 2019-11-12 ENCOUNTER — Ambulatory Visit (INDEPENDENT_AMBULATORY_CARE_PROVIDER_SITE_OTHER): Payer: 59 | Admitting: Adult Health

## 2019-11-12 ENCOUNTER — Other Ambulatory Visit: Payer: Self-pay

## 2019-11-12 DIAGNOSIS — D869 Sarcoidosis, unspecified: Secondary | ICD-10-CM

## 2019-11-12 DIAGNOSIS — J209 Acute bronchitis, unspecified: Secondary | ICD-10-CM

## 2019-11-12 MED ORDER — PREDNISONE 10 MG PO TABS: ORAL_TABLET | ORAL | 0 refills | Status: DC

## 2019-11-12 MED ORDER — AMOXICILLIN-POT CLAVULANATE 875-125 MG PO TABS: 1.0000 | ORAL_TABLET | Freq: Two times a day (BID) | ORAL | 0 refills | Status: AC

## 2019-11-12 NOTE — Patient Instructions (Signed)
Augmentin 875mg  Twice daily  For 1 week, take with food.  Prednisone taper over next week.  Mucinex DM Twice daily  As needed  Cough/congestion .  Fluids and rest  Call if Covid test is positive.  Follow up with Dr. Hermina Staggers or Rorik Vespa NP  3 weeks  with PFT-Spirometry with DLCO . and As needed   Please contact office for sooner follow up if symptoms do not improve or worsen or seek emergency care

## 2019-11-12 NOTE — Progress Notes (Signed)
Virtual Visit via Telephone Note  I connected with Jeremiah Mason on 11/12/19 at 12:00 PM EDT by telephone and verified that I am speaking with the correct person using two identifiers.  Location: Patient: Home  Provider: Office    I discussed the limitations, risks, security and privacy concerns of performing an evaluation and management service by telephone and the availability of in person appointments. I also discussed with the patient that there may be a patient responsible charge related to this service. The patient expressed understanding and agreed to proceed.   History of Present Illness: 34 year old male former smoker followed for sarcoidosis Initially seen for pulmonary consult January 2020 during hospitalization for cough and abnormal CT chest.  Patient underwent EBUS with cytology positive for granulomas consistent with sarcoidosis  Today's televisit is for an acute office visit.  Patient complains over the last week he has had fever cough, thick yellow mucus and pleuritic pain.  Patient denies any hemoptysis chest pain orthopnea loss of taste or smell.  Patient did get tested for COVID-19 today.  Covid vaccines are up-to-date Covid vaccine was 2 weeks ago.  No known sick contact. Patient's appetite is fair.  No nausea vomiting or diarrhea.  As above patient has underlying sarcoidosis.  Was treated for prolonged steroids in early 2020.  Weaned off of all steroids mid April 2020.  He was last seen in the office February 2021.  Had had a recent flare prior to this visit was given a short course of prednisone with significant clinical improvement.  Chest x-ray in January 2021 showed clear lungs.    Observations/Objective: CTA Chest 02/11/18>>neg for PE, adenopathy, peribronchial soft tissue thickening, multifocal mass like consolidations, small perihilar and subpleural nodules  ACE 1/27 >> 98 ESR 1/27 >> 27 HIV neg  AFB neg smear  BAL Neg  Fungal neg   CT chest Jun 12, 2018 resolution of bilateral pulmonary nodules.  Positive response with reduction in hilar and mediastinal adenopathy   Assessment and Plan: Acute bronchitis Patient is to report if his COVID-19 testing comes back positive.  We will treat with empiric antibiotics and steroids.  Patient needs follow-up spirometry with DLCO on return visit  Plan  Patient Instructions  Augmentin 811m Twice daily  For 1 week, take with food.  Prednisone taper over next week.  Mucinex DM Twice daily  As needed  Cough/congestion .  Fluids and rest  Call if Covid test is positive.  Follow up with Dr. OHermina Staggersor Ayvin Lipinski NP  3 weeks  with PFT-Spirometry with DLCO . and As needed   Please contact office for sooner follow up if symptoms do not improve or worsen or seek emergency care        Follow Up Instructions:    I discussed the assessment and treatment plan with the patient. The patient was provided an opportunity to ask questions and all were answered. The patient agreed with the plan and demonstrated an understanding of the instructions.   The patient was advised to call back or seek an in-person evaluation if the symptoms worsen or if the condition fails to improve as anticipated.  I provided 22 minutes of non-face-to-face time during this encounter.   TRexene Edison NP

## 2020-01-19 IMAGING — DX DG CHEST 2V
2 series · 2 of 2 positions shown · non-contrast
Comparison: 04/01/2017

CLINICAL DATA: Chest pain and shortness of breath

EXAM:
CHEST - 2 VIEW

[chest pa]
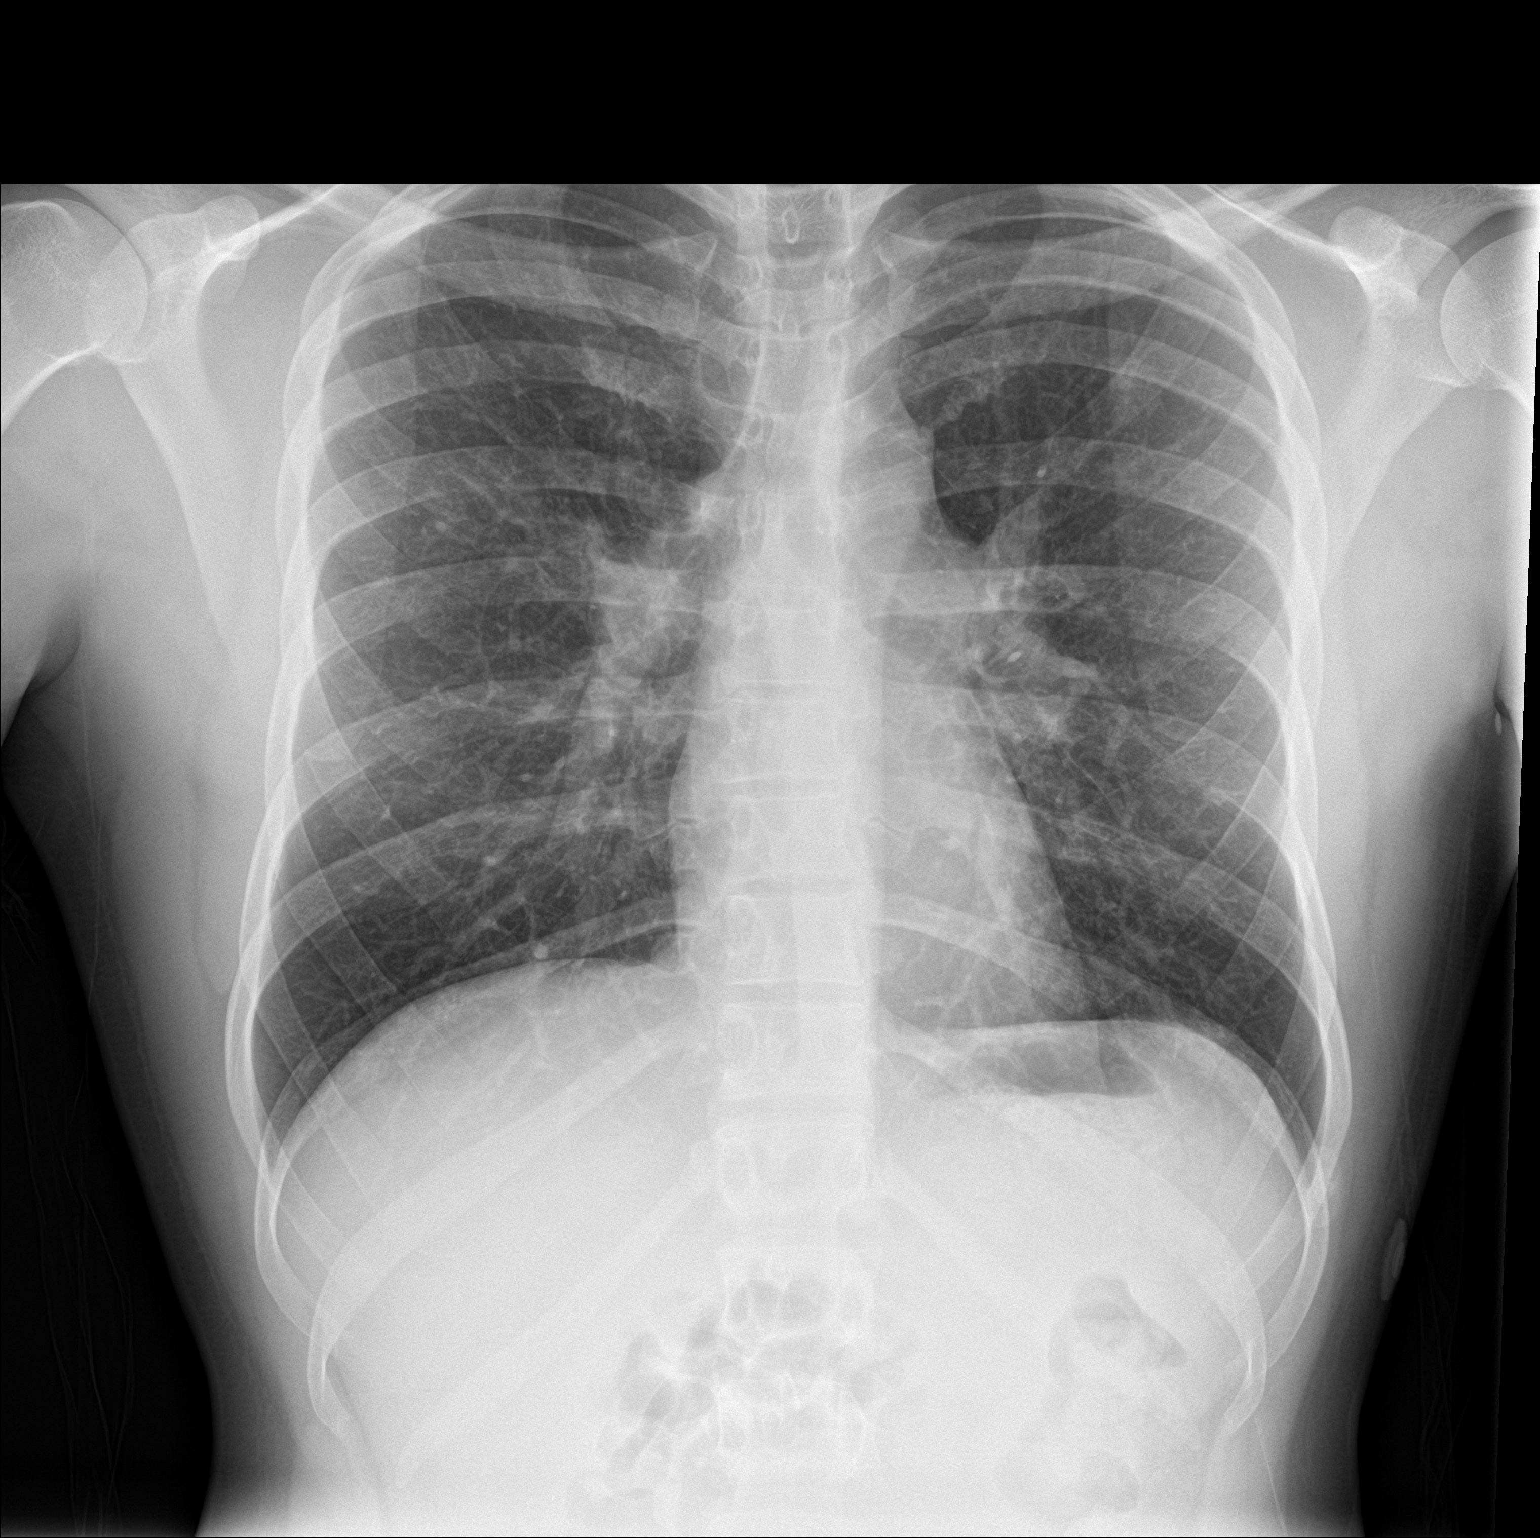

[chest lat]
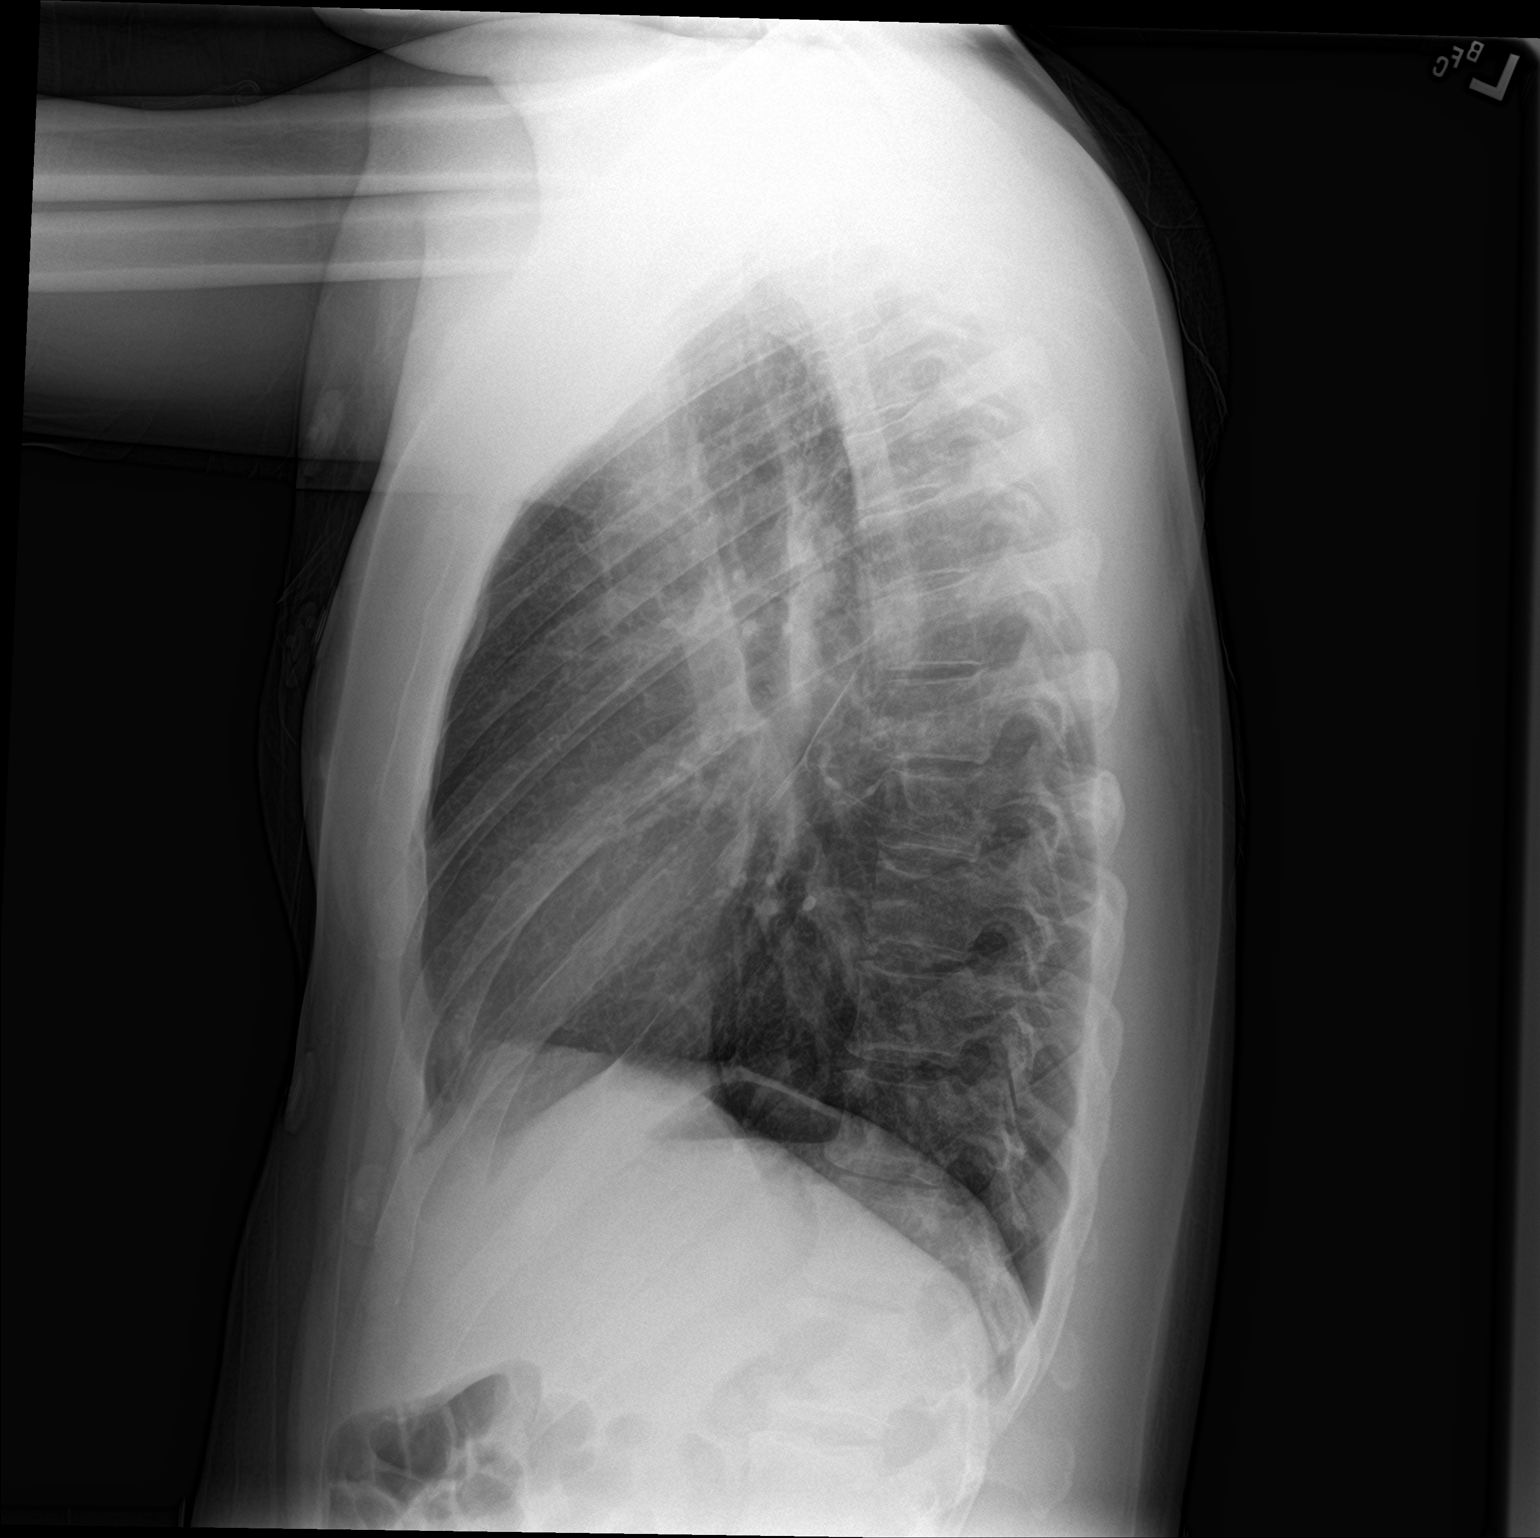

[2 of 2 positions shown; findings below may reference images not displayed]

FINDINGS: Cardiac shadow is within normal limits. Lungs are well aerated
bilaterally. No focal infiltrate or sizable effusion is seen. No
acute bony abnormality is noted.
IMPRESSION: No active cardiopulmonary disease.

## 2020-01-21 IMAGING — DX DG CHEST 1V PORT
1 series · 1 of 1 positions shown · non-contrast
Comparison: 02/11/2018

CLINICAL DATA: Increased cough and chest pain following
bronchoscopy

EXAM:
PORTABLE CHEST 1 VIEW

[chest ap]
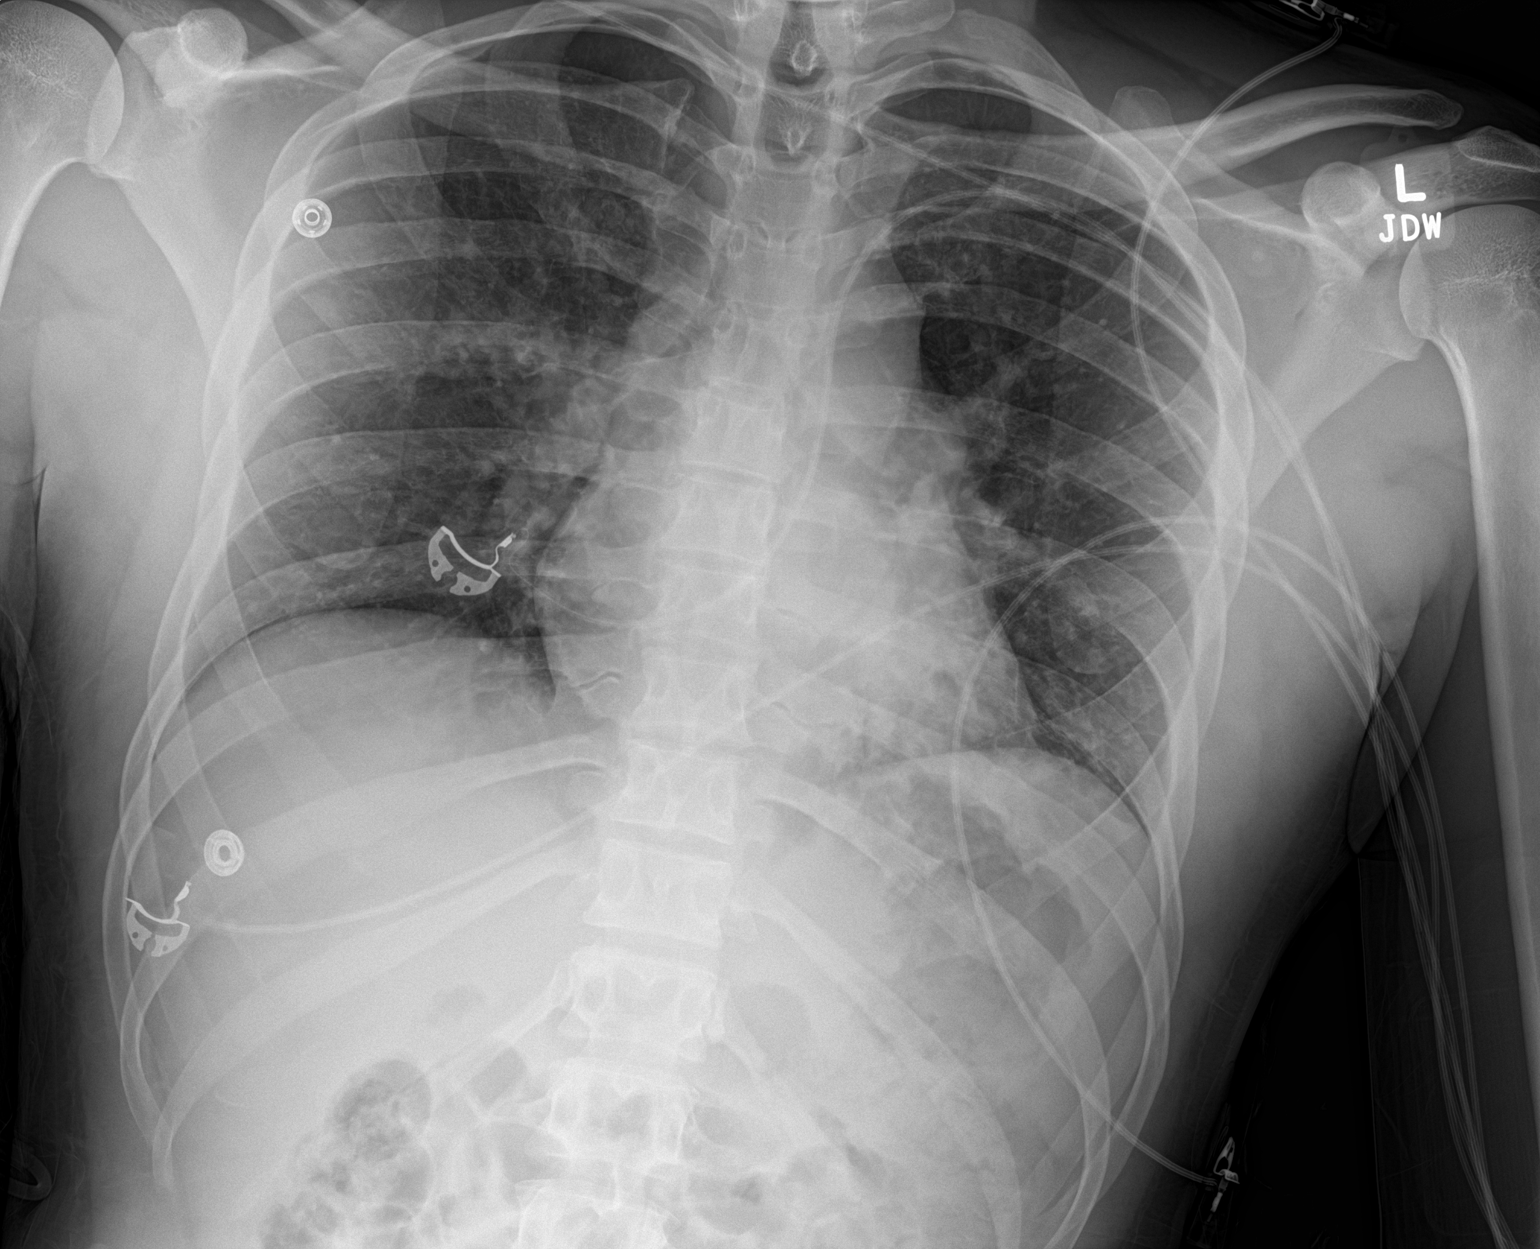

[1 of 1 positions shown; findings below may reference images not displayed]

FINDINGS: Cardiac shadows within normal limits. Mild fullness is noted
consistent with the known history of hilar and mediastinal
adenopathy. The overall inspiratory effort is poor. No pneumothorax
is noted. Mild left basilar atelectasis is noted. No bony
abnormality is noted.
IMPRESSION: Poor inspiratory effort with mild left basilar atelectasis.

## 2022-12-20 ENCOUNTER — Ambulatory Visit
Admission: EM | Admit: 2022-12-20 | Discharge: 2022-12-20 | Disposition: A | Discharge status: Home / Self Care | Payer: BLUE CROSS/BLUE SHIELD | Attending: Physician Assistant | Admitting: Physician Assistant

## 2022-12-20 DIAGNOSIS — J40 Bronchitis, not specified as acute or chronic: Secondary | ICD-10-CM

## 2022-12-20 DIAGNOSIS — J329 Chronic sinusitis, unspecified: Secondary | ICD-10-CM | POA: Diagnosis not present

## 2022-12-20 LAB — POCT RAPID STREP A (OFFICE): Rapid Strep A Screen: NEGATIVE

## 2022-12-20 MED ORDER — AMOXICILLIN-POT CLAVULANATE 875-125 MG PO TABS: 1.0000 | ORAL_TABLET | Freq: Two times a day (BID) | ORAL | 0 refills | Status: AC

## 2022-12-20 MED ORDER — PREDNISONE 20 MG PO TABS: 40.0000 mg | ORAL_TABLET | Freq: Every day | ORAL | 0 refills | Status: AC

## 2022-12-20 NOTE — ED Triage Notes (Signed)
"  This started on Sunday with Throat itching and hurting yesterday, this morning couldn't swallow well with probable fever (unknown degree)". "I also think my ears may be infected starting a few wks ago and this is just worse". No fever.

## 2022-12-20 NOTE — ED Provider Notes (Signed)
Jeremiah Mason    CSN: [id number] Arrival date & time: 12/20/22  1351      History   Chief Complaint Chief Complaint  Patient presents with   Fever   Sore Throat    HPI Jeremiah Mason is a 37 y.o. male.   Patient here today for evaluation of throat itching and pain with some increased difficulty swallowing that started a few days ago, and ear pain that started several weeks ago.  He has not any fever.  He denies any vomiting or diarrhea.  He has had some cough.  The history is provided by the patient.  Fever Associated symptoms: congestion, cough, ear pain and sore throat   Associated symptoms: no chills, no nausea and no vomiting   Sore Throat Pertinent negatives include no abdominal pain and no shortness of breath.    Past Medical History:  Diagnosis Date   Asthma    BACK PAIN 01/30/2008   Chronic from 5 accidents one year  MRI: 05/2007 normal  Therapies tried:   PT x 1 year    Chiropractor x 1 year (adjustment)   Ortho, epidural injections (Dr. Prince Rome): helped but too expensive   Meds: Percocet/Vicodin (helped), OTC (ibuprofen)    COMMON MIGRAINE 04/21/2009   Qualifier: Diagnosis of  By: Constance Goltz MD, Matthew     Hypertension    INSOMNIA 09/14/2007   Qualifier: Diagnosis of  By: Humberto Seals NP, Floyce Stakes ACL repair 2016   R knee   Sarcoidosis     Patient Active Problem List   Diagnosis Date Noted   Chest tightness 02/25/2019   Hyperglycemia 03/14/2018   Sarcoidosis 03/01/2018   Exercise-induced asthma 10/09/2016   Change in skin mole 10/09/2016   S/P ACL repair 07/15/2014   HTN (hypertension) 03/14/2014   Cough 05/18/2010   Migraine without aura 04/21/2009    Past Surgical History:  Procedure Laterality Date   ANTERIOR CRUCIATE LIGAMENT REPAIR Right 2016   ORIF FINGER FRACTURE Right 2011   5th finger. broken in 3 places-4 pins put in   VIDEO BRONCHOSCOPY WITH ENDOBRONCHIAL ULTRASOUND N/A 02/13/2018   Procedure: VIDEO BRONCHOSCOPY WITH ENDOBRONCHIAL  ULTRASOUND;  Surgeon: Lupita Leash, MD;  Location: [facility] OR;  Service: Thoracic;  Laterality: N/A;       Home Medications    Prior to Admission medications   Medication Sig Start Date End Date Taking? Authorizing Provider  amLODipine (NORVASC) 10 MG tablet Take 1 tablet (10 mg total) by mouth daily. 03/14/18  Yes Storm Frisk, MD  amoxicillin-clavulanate (AUGMENTIN) 875-125 MG tablet Take 1 tablet by mouth every 12 (twelve) hours. 12/20/22  Yes Tomi Bamberger, PA-C  predniSONE (DELTASONE) 20 MG tablet Take 2 tablets (40 mg total) by mouth daily with breakfast for 5 days. 12/20/22 12/25/22 Yes Tomi Bamberger, PA-C  Pseudoephedrine-Naproxen Na (ALEVE-D SINUS & COLD PO) Take by mouth.   Yes [provider]  UNABLE TO FIND Med Name: OTC Lozenge for Sore throat.   Yes [provider]  albuterol (PROVENTIL HFA;VENTOLIN HFA) 108 (90 Base) MCG/ACT inhaler Inhale 2 puffs into the lungs every 6 (six) hours as needed for wheezing. 10/07/16 06/14/19  Leland Her, DO  aspirin-acetaminophen-caffeine (EXCEDRIN MIGRAINE) [phone] MG tablet Take 1 tablet by mouth 3 (three) times daily as needed for headache or migraine.    [provider]  ibuprofen (ADVIL) 800 MG tablet Take 1 tablet (800 mg total) by mouth 3 (three) times daily. 02/28/19  Sande Rives, MD    Family History Family History  Problem Relation Age of Onset   Diabetes Mother    Hypertension Mother     Social History Social History   Tobacco Use   Smoking status: Former    Current packs/day: 0.00    Average packs/day: 0.5 packs/day for 4.0 years (2.0 ttl pk-yrs)    Types: Cigars, Cigarettes    Start date: 09/27/2010    Quit date: 09/27/2014    Years since quitting: 8.2   Smokeless tobacco: Never  Vaping Use   Vaping status: Never Used  Substance Use Topics   Alcohol use: Not Currently    Comment: very rare   Drug use: No     Allergies   Patient has no known allergies.   Review  of Systems Review of Systems  Constitutional:  Negative for chills and fever.  HENT:  Positive for congestion, ear pain and sore throat.   Eyes:  Negative for discharge and redness.  Respiratory:  Positive for cough. Negative for shortness of breath.   Gastrointestinal:  Negative for abdominal pain, nausea and vomiting.     Physical Exam Triage Vital Signs ED Triage Vitals  Encounter Vitals Group     BP 12/20/22 1409 (!) 143/90     Systolic BP Percentile --      Diastolic BP Percentile --      Pulse Rate 12/20/22 1409 77     Resp 12/20/22 1409 18     Temp 12/20/22 1409 98.3 F (36.8 C)     Temp Source 12/20/22 1409 Oral     SpO2 12/20/22 1409 99 %     Weight 12/20/22 1406 215 lb (97.5 kg)     Height 12/20/22 1406 5\' 10"  (1.778 m)     Head Circumference --      Peak Flow --      Pain Score 12/20/22 1403 8     Pain Loc --      Pain Education --      Exclude from Growth Chart --    No data found.  Updated Vital Signs BP (!) 143/90 (BP Location: Left Arm) Comment: "To recheck during or after visit"  Pulse 77   Temp 98.3 F (36.8 C) (Oral)   Resp 18   Ht 5\' 10"  (1.778 m)   Wt 215 lb (97.5 kg)   SpO2 99%   BMI 30.85 kg/m   Visual Acuity Right Eye Distance:   Left Eye Distance:   Bilateral Distance:    Right Eye Near:   Left Eye Near:    Bilateral Near:     Physical Exam Vitals and nursing note reviewed.  Constitutional:      General: He is not in acute distress.    Appearance: Normal appearance. He is not ill-appearing.  HENT:     Head: Normocephalic and atraumatic.     Right Ear: Tympanic membrane normal.     Left Ear: Tympanic membrane normal.     Nose: Congestion present.     Mouth/Throat:     Mouth: Mucous membranes are moist.     Pharynx: Oropharynx is clear. No oropharyngeal exudate or posterior oropharyngeal erythema.  Eyes:     Conjunctiva/sclera: Conjunctivae normal.  Cardiovascular:     Rate and Rhythm: Normal rate and regular rhythm.      Heart sounds: Normal heart sounds. No murmur heard. Pulmonary:     Effort: Pulmonary effort is normal. No respiratory distress.  Breath sounds: Normal breath sounds. No wheezing, rhonchi or rales.  Skin:    General: Skin is warm and dry.  Neurological:     Mental Status: He is alert.  Psychiatric:        Mood and Affect: Mood normal.        Thought Content: Thought content normal.      UC Treatments / Results  Labs (all labs ordered are listed, but only abnormal results are displayed) Labs Reviewed  POCT RAPID STREP A (OFFICE) - Normal    EKG   Radiology No results found.  Procedures Procedures (including critical Mason time)  Medications Ordered in UC Medications - No data to display  Initial Impression / Assessment and Plan / UC Course  I have reviewed the triage vital signs and the nursing notes.  Pertinent labs & imaging results that were available during my Mason of the patient were reviewed by me and considered in my medical decision making (see chart for details).    Will treat to cover sinobronchitis with steroid burst and Augmentin.  Recommended follow-up if no gradual improvement with any further concerns.  Patient expressed understanding.  Final Clinical Impressions(s) / UC Diagnoses   Final diagnoses:  Sinobronchitis   Discharge Instructions   None    ED Prescriptions     Medication Sig Dispense Auth. Provider   predniSONE (DELTASONE) 20 MG tablet Take 2 tablets (40 mg total) by mouth daily with breakfast for 5 days. 10 tablet Tomi Bamberger, PA-C   amoxicillin-clavulanate (AUGMENTIN) 875-125 MG tablet Take 1 tablet by mouth every 12 (twelve) hours. 14 tablet Tomi Bamberger, PA-C      PDMP not reviewed this encounter.   Tomi Bamberger, PA-C 12/20/22 1558

## 2023-11-17 ENCOUNTER — Ambulatory Visit (INDEPENDENT_AMBULATORY_CARE_PROVIDER_SITE_OTHER): Payer: Self-pay | Admitting: Family

## 2023-11-17 ENCOUNTER — Encounter: Payer: Self-pay | Admitting: Family

## 2023-11-17 VITALS — BP 144/111 | HR 74 | Temp 98.8°F | Resp 16 | Ht 70.0 in | Wt 233.0 lb

## 2023-11-17 DIAGNOSIS — Z1159 Encounter for screening for other viral diseases: Secondary | ICD-10-CM

## 2023-11-17 DIAGNOSIS — Z1329 Encounter for screening for other suspected endocrine disorder: Secondary | ICD-10-CM

## 2023-11-17 DIAGNOSIS — Z1321 Encounter for screening for nutritional disorder: Secondary | ICD-10-CM

## 2023-11-17 DIAGNOSIS — Z130 Encounter for screening for diseases of the blood and blood-forming organs and certain disorders involving the immune mechanism: Secondary | ICD-10-CM

## 2023-11-17 DIAGNOSIS — Z0000 Encounter for general adult medical examination without abnormal findings: Secondary | ICD-10-CM

## 2023-11-17 DIAGNOSIS — Z131 Encounter for screening for diabetes mellitus: Secondary | ICD-10-CM

## 2023-11-17 DIAGNOSIS — Z7689 Persons encountering health services in other specified circumstances: Secondary | ICD-10-CM

## 2023-11-17 DIAGNOSIS — Z13228 Encounter for screening for other metabolic disorders: Secondary | ICD-10-CM

## 2023-11-17 DIAGNOSIS — F419 Anxiety disorder, unspecified: Secondary | ICD-10-CM

## 2023-11-17 DIAGNOSIS — F418 Other specified anxiety disorders: Secondary | ICD-10-CM

## 2023-11-17 DIAGNOSIS — I10 Essential (primary) hypertension: Secondary | ICD-10-CM | POA: Diagnosis not present

## 2023-11-17 DIAGNOSIS — Z13220 Encounter for screening for lipoid disorders: Secondary | ICD-10-CM

## 2023-11-17 MED ORDER — AMLODIPINE BESYLATE 10 MG PO TABS: 10.0000 mg | ORAL_TABLET | Freq: Every day | ORAL | 0 refills | Status: AC

## 2023-11-17 NOTE — Progress Notes (Signed)
 Subjective:    Jeremiah Mason - 38 y.o. male MRN [id number]  Date of birth: July 17, 1985  HPI  Jeremiah Mason is to establish care and annual physical exam.   Current issues and/or concerns: - High blood pressure. States he has not taken Amlodipine  10 mg in 3 weeks due to former primary provider would not refill prescription. He is trying to limit salt intake. He does not exercise outside of normal routine. He does not complain of red flag symptoms such as but not limited to chest pain, shortness of breath, worst headache of life, nausea/vomiting.  - Requests testosterone lab due to states hot flashes. - Anxiety depression. Established with therapist. He denies thoughts of self-harm, suicidal ideations, homicidal ideations. - Established with specialist for sarcoidosis.   ROS per HPI    Health Maintenance: Health Maintenance Due  Topic Date Due   Hepatitis C Screening  Never done   Pneumococcal Vaccine (1 of 2 - PCV) Never done   Hepatitis B Vaccines 19-59 Average Risk (1 of 3 - 19+ 3-dose series) Never done   HPV VACCINES (1 - 3-dose SCDM series) Never done   Influenza Vaccine  08/18/2023   COVID-19 Vaccine (3 - 2025-26 season) 09/18/2023     Past Medical History: Patient Active Problem List   Diagnosis Date Noted   Chest tightness 02/25/2019   Hyperglycemia 03/14/2018   Sarcoidosis 03/01/2018   Exercise-induced asthma 10/09/2016   Change in skin mole 10/09/2016   S/P ACL repair 07/15/2014   HTN (hypertension) 03/14/2014   Cough 05/18/2010   Migraine without aura 04/21/2009      Social History   reports that he quit smoking about 9 years ago. His smoking use included cigars and cigarettes. He started smoking about 13 years ago. He has a 2 pack-year smoking history. He has never used smokeless tobacco. He reports that he does not currently use alcohol. He reports that he does not use drugs.   Family History  family history includes Diabetes in his mother;  Hypertension in his mother.   Medications: reviewed and updated   Objective:   Physical Exam BP (!) 144/111   Pulse 74   Temp 98.8 F (37.1 C) (Oral)   Resp 16   Ht 5' 10 (1.778 m)   Wt 233 lb (105.7 kg)   SpO2 93%   BMI 33.43 kg/m      11/17/2023   10:47 AM 11/17/2023   10:26 AM 12/20/2022    2:09 PM  Vitals with BMI  Height  5' 10   Weight  233 lbs   BMI  33.43   Systolic 144 153 856  Diastolic 111 102 90  Pulse  74 77   Physical Exam HENT:     Head: Normocephalic and atraumatic.     Right Ear: Tympanic membrane, ear canal and external ear normal.     Left Ear: Tympanic membrane, ear canal and external ear normal.     Nose: Nose normal.     Mouth/Throat:     Mouth: Mucous membranes are moist.     Pharynx: Oropharynx is clear.  Eyes:     Extraocular Movements: Extraocular movements intact.     Conjunctiva/sclera: Conjunctivae normal.     Pupils: Pupils are equal, round, and reactive to light.  Neck:     Thyroid: No thyroid mass, thyromegaly or thyroid tenderness.  Cardiovascular:     Rate and Rhythm: Normal rate and regular rhythm.     Pulses: Normal pulses.  Heart sounds: Normal heart sounds.  Pulmonary:     Effort: Pulmonary effort is normal.     Breath sounds: Normal breath sounds.  Abdominal:     General: Bowel sounds are normal.     Palpations: Abdomen is soft.  Genitourinary:    Comments: Patient declined. Musculoskeletal:        General: Normal range of motion.     Right shoulder: Normal.     Left shoulder: Normal.     Right upper arm: Normal.     Left upper arm: Normal.     Right elbow: Normal.     Left elbow: Normal.     Right forearm: Normal.     Left forearm: Normal.     Right wrist: Normal.     Left wrist: Normal.     Right hand: Normal.     Left hand: Normal.     Cervical back: Normal, normal range of motion and neck supple.     Thoracic back: Normal.     Lumbar back: Normal.     Right hip: Normal.     Left hip: Normal.      Right upper leg: Normal.     Left upper leg: Normal.     Right knee: Normal.     Left knee: Normal.     Right lower leg: Normal.     Left lower leg: Normal.     Right ankle: Normal.     Left ankle: Normal.     Right foot: Normal.     Left foot: Normal.  Skin:    General: Skin is warm and dry.     Capillary Refill: Capillary refill takes less than 2 seconds.  Neurological:     General: No focal deficit present.     Mental Status: He is alert and oriented to person, place, and time.  Psychiatric:        Mood and Affect: Mood normal.        Behavior: Behavior normal.     Assessment & Plan:  1. Encounter to establish care (Primary) 2. Annual physical exam - Counseled on 150 minutes of exercise per week as tolerated, healthy eating (including decreased daily intake of saturated fats, cholesterol, added sugars, sodium), STI prevention, and routine healthcare maintenance.  3. Screening for metabolic disorder - Routine screening.  - CMP14+EGFR - Testosterone  4. Screening for deficiency anemia - Routine screening.  - CBC  5. Diabetes mellitus screening - Routine screening.  - Hemoglobin A1c  6. Screening cholesterol level - Routine screening.  - Lipid panel  7. Thyroid disorder screen - Routine screening.  - TSH  8. Need for hepatitis C screening test - Routine screening.  - Hepatitis C Antibody  9. Encounter for vitamin deficiency screening - Routine screening.  - Vitamin D, 25-hydroxy  10. Primary hypertension - Blood pressure not at goal during today's visit. Patient asymptomatic without chest pressure, chest pain, palpitations, shortness of breath, worst headache of life, and any additional red flag symptoms. - Resume Amlodipine  as prescribed.  - Counseled on blood pressure goal of less than 130/80, low-sodium, DASH diet, medication compliance, and 150 minutes of moderate intensity exercise per week as tolerated. Counseled on medication adherence and adverse  effects. - Follow-up with primary provider in 4 weeks or sooner if needed.  - amLODipine  (NORVASC ) 10 MG tablet; Take 1 tablet (10 mg total) by mouth daily.  Dispense: 90 tablet; Refill: 0  11. Anxiety and depression - Patient declined pharmacological  therapy.  - Patient declined referral to Psychiatry. - Keep all scheduled appointments with established therapist.  - Follow-up with primary provider as scheduled.    Patient was given clear instructions to go to Emergency Department or return to medical center if symptoms don't improve, worsen, or new problems develop.The patient verbalized understanding.  I discussed the assessment and treatment plan with the patient. The patient was provided an opportunity to ask questions and all were answered. The patient agreed with the plan and demonstrated an understanding of the instructions.   The patient was advised to call back or seek an in-person evaluation if the symptoms worsen or if the condition fails to improve as anticipated.    Greig Drones, NP 11/17/2023, 10:52 AM Primary Care at [facility]

## 2023-11-17 NOTE — Progress Notes (Signed)
 Establish care physical, and Testerone check, patient scored a 7 on the PHQ-9,

## 2023-11-18 LAB — CBC
Hematocrit: 46.7 % (ref 37.5–51.0)
Hemoglobin: 14.8 g/dL (ref 13.0–17.7)
MCH: 27.8 pg (ref 26.6–33.0)
MCHC: 31.7 g/dL (ref 31.5–35.7)
MCV: 88 fL (ref 79–97)
Platelets: 230 x10E3/uL (ref 150–450)
RBC: 5.33 x10E6/uL (ref 4.14–5.80)
RDW: 13.8 % (ref 11.6–15.4)
WBC: 3.2 x10E3/uL — ABNORMAL LOW (ref 3.4–10.8)

## 2023-11-18 LAB — CMP14+EGFR
ALT: 32 IU/L (ref 0–44)
AST: 24 IU/L (ref 0–40)
Albumin: 4.6 g/dL (ref 4.1–5.1)
Alkaline Phosphatase: 61 IU/L (ref 47–123)
BUN/Creatinine Ratio: 12 (ref 9–20)
BUN: 13 mg/dL (ref 6–20)
Bilirubin Total: 0.2 mg/dL (ref 0.0–1.2)
CO2: 24 mmol/L (ref 20–29)
Calcium: 10.2 mg/dL (ref 8.7–10.2)
Chloride: 104 mmol/L (ref 96–106)
Creatinine, Ser: 1.08 mg/dL (ref 0.76–1.27)
Globulin, Total: 2.8 g/dL (ref 1.5–4.5)
Glucose: 111 mg/dL — ABNORMAL HIGH (ref 70–99)
Potassium: 4.7 mmol/L (ref 3.5–5.2)
Sodium: 141 mmol/L (ref 134–144)
Total Protein: 7.4 g/dL (ref 6.0–8.5)
eGFR: 91 mL/min/1.73 (ref >59)

## 2023-11-18 LAB — VITAMIN D 25 HYDROXY (VIT D DEFICIENCY, FRACTURES): Vit D, 25-Hydroxy: 18.2 ng/mL — ABNORMAL LOW (ref 30.0–100.0)

## 2023-11-18 LAB — HEMOGLOBIN A1C
Est. average glucose Bld gHb Est-mCnc: 137 mg/dL
Hgb A1c MFr Bld: 6.4 % — ABNORMAL HIGH (ref 4.8–5.6)

## 2023-11-18 LAB — TESTOSTERONE: Testosterone: 109 ng/dL — ABNORMAL LOW (ref 264–916)

## 2023-11-18 LAB — LIPID PANEL
Chol/HDL Ratio: 4.9 ratio (ref 0.0–5.0)
Cholesterol, Total: 186 mg/dL (ref 100–199)
HDL: 38 mg/dL — ABNORMAL LOW (ref >39)
LDL Chol Calc (NIH): 124 mg/dL — ABNORMAL HIGH (ref 0–99)
Triglycerides: 130 mg/dL (ref 0–149)
VLDL Cholesterol Cal: 24 mg/dL (ref 5–40)

## 2023-11-18 LAB — TSH: TSH: 1.63 u[IU]/mL (ref 0.450–4.500)

## 2023-11-18 LAB — HEPATITIS C ANTIBODY: Hep C Virus Ab: NONREACTIVE

## 2023-11-20 ENCOUNTER — Ambulatory Visit: Payer: Self-pay | Admitting: Family

## 2023-11-20 DIAGNOSIS — R7989 Other specified abnormal findings of blood chemistry: Secondary | ICD-10-CM

## 2023-11-20 DIAGNOSIS — Z13220 Encounter for screening for lipoid disorders: Secondary | ICD-10-CM

## 2023-11-20 DIAGNOSIS — E559 Vitamin D deficiency, unspecified: Secondary | ICD-10-CM

## 2023-11-20 MED ORDER — VITAMIN D (ERGOCALCIFEROL) 1.25 MG (50000 UNIT) PO CAPS: 50000.0000 [IU] | ORAL_CAPSULE | ORAL | 0 refills | Status: AC

## 2023-11-28 NOTE — Telephone Encounter (Signed)
 Copied from CRM [id number]. Topic: Referral - Status >> Nov 28, 2023  3:38 PM Emylou G wrote: Reason for CRM: Pls call patient - I adv of turn time for referral - but still had concerns nobody responded on mychart?

## 2023-11-29 ENCOUNTER — Telehealth: Payer: Self-pay | Admitting: Emergency Medicine

## 2023-11-29 NOTE — Telephone Encounter (Signed)
 I call patient about referral and made him aware no one has answered back yet and made him aware that I will check on it for him.

## 2023-12-19 NOTE — Progress Notes (Unsigned)
 Chief Complaint: Abnormal testosterone  level  History of Present Illness:  38 yo male here for mgmt of low T. Previous data--  10.31.2025--109 (Drawn @ 1120)  His symptoms have included hot flashes, low energy level and easy fatigue.  These started several months ago.  He is treated for sarcoid.  Past Medical History:  Past Medical History:  Diagnosis Date   Asthma    BACK PAIN 01/30/2008   Chronic from 5 accidents one year  MRI: 05/2007 normal  Therapies tried:   PT x 1 year    Chiropractor x 1 year (adjustment)   Ortho, epidural injections (Dr. Hughie): helped but too expensive   Meds: Percocet/Vicodin (helped), OTC (ibuprofen )    COMMON MIGRAINE 04/21/2009   Qualifier: Diagnosis of  By: Chandra MD, Matthew     Hypertension    INSOMNIA 09/14/2007   Qualifier: Diagnosis of  By: Gerlene NP, Devere KUGEL ACL repair 2016   R knee   Sarcoidosis     Past Surgical History:  Past Surgical History:  Procedure Laterality Date   ANTERIOR CRUCIATE LIGAMENT REPAIR Right 2016   ORIF FINGER FRACTURE Right 2011   5th finger. broken in 3 places-4 pins put in   VIDEO BRONCHOSCOPY WITH ENDOBRONCHIAL ULTRASOUND N/A 02/13/2018   Procedure: VIDEO BRONCHOSCOPY WITH ENDOBRONCHIAL ULTRASOUND;  Surgeon: Alaine Vicenta NOVAK, MD;  Location: [facility] OR;  Service: Thoracic;  Laterality: N/A;    Allergies:  No Known Allergies  Family History:  Family History  Problem Relation Age of Onset   Diabetes Mother    Hypertension Mother     Social History:  Social History   Tobacco Use   Smoking status: Former    Current packs/day: 0.00    Average packs/day: 0.5 packs/day for 4.0 years (2.0 ttl pk-yrs)    Types: Cigars, Cigarettes    Start date: 09/27/2010    Quit date: 09/27/2014    Years since quitting: 9.2   Smokeless tobacco: Never  Vaping Use   Vaping status: Never Used  Substance Use Topics   Alcohol use: Not Currently    Comment: very rare   Drug use: No    Review of symptoms:   Constitutional:  Negative for unexplained weight loss, night sweats, fever, chills ENT:  Negative for nose bleeds, sinus pain, painful swallowing CV:  Negative for chest pain, shortness of breath, exercise intolerance, palpitations, loss of consciousness Resp:  Negative for cough, wheezing, shortness of breath GI:  Negative for nausea, vomiting, diarrhea, bloody stools GU:  Positives noted in HPI; otherwise negative for gross hematuria, dysuria, urinary incontinence Neuro:  Negative for seizures, poor balance, limb weakness, slurred speech Psych:  Negative for lack of energy, depression, anxiety Endocrine:  Negative for polydipsia, polyuria, symptoms of hypoglycemia (dizziness, hunger, sweating) Hematologic:  Negative for anemia, purpura, petechia, prolonged or excessive bleeding, use of anticoagulants  Allergic:  Negative for difficulty breathing or choking as a result of exposure to anything; no shellfish allergy; no allergic response (rash/itch) to materials, foods  Physical exam: There were no vitals taken for this visit. GENERAL APPEARANCE:  Well appearing, well developed, well nourished, NAD HEENT: Atraumatic, Normocephalic. NECK: Normal appearance LUNGS: Normal inspiratory and expiratory excursion HEART: Regular Rate ABDOMEN: No inguinal hernias GU: Phallus normal, no lesions. Scrotal skin normal. Testicles/epididymal structures normal. Meatus normal.  EXTREMITIES: Moves all extremities well.  Without clubbing, cyanosis, or edema. NEUROLOGIC:  Alert and oriented x 3, normal gait, CN II-XII grossly intact.  MENTAL  STATUS:  Appropriate. SKIN:  Warm, dry and intact.      I have reviewed referring/prior physicians notes  I have reviewed urinalysis  Assessment: Hypogonadism--pt significantly symptomatic   Plan: I will check a testosterone  panel as well as LH/FSH and prolactin early in the morning in the near future  I did discuss eventual treatment options with  him-clomiphene citrate 25 mg 3 days a week versus testosterone  cypionate on a weekly basis.  In this 38 year old, I favor the clomiphene.  Will let him know the results as well as any follow-up.  Once we get started, we will check labs in about 6 weeks

## 2023-12-20 ENCOUNTER — Ambulatory Visit: Payer: Self-pay | Admitting: Urology

## 2023-12-20 VITALS — BP 164/87 | HR 97 | Ht 70.0 in | Wt 250.0 lb

## 2023-12-20 DIAGNOSIS — E291 Testicular hypofunction: Secondary | ICD-10-CM

## 2023-12-22 ENCOUNTER — Other Ambulatory Visit: Payer: Self-pay

## 2023-12-22 DIAGNOSIS — E291 Testicular hypofunction: Secondary | ICD-10-CM

## 2023-12-23 LAB — FSH/LH
FSH: 2.3 m[IU]/mL (ref 1.5–12.4)
LH: 3.4 m[IU]/mL (ref 1.7–8.6)

## 2023-12-23 LAB — PROLACTIN: Prolactin: 21.2 ng/mL (ref 3.9–22.7)

## 2023-12-24 ENCOUNTER — Other Ambulatory Visit: Payer: Self-pay | Admitting: Urology

## 2023-12-24 ENCOUNTER — Ambulatory Visit: Payer: Self-pay | Admitting: Urology

## 2023-12-24 DIAGNOSIS — E291 Testicular hypofunction: Secondary | ICD-10-CM

## 2023-12-24 MED ORDER — CLOMIPHENE CITRATE 50 MG PO TABS: ORAL_TABLET | ORAL | 11 refills | Status: AC

## 2023-12-29 ENCOUNTER — Ambulatory Visit: Admitting: Family

## 2024-01-08 ENCOUNTER — Other Ambulatory Visit: Payer: Self-pay | Admitting: Family

## 2024-01-08 DIAGNOSIS — E559 Vitamin D deficiency, unspecified: Secondary | ICD-10-CM

## 2024-01-08 NOTE — Telephone Encounter (Signed)
 Copied from CRM #[id number]. Topic: Clinical - Medication Refill >> Jan 08, 2024  3:37 PM Emylou G wrote: Medication: Vitamin D , Ergocalciferol , (DRISDOL ) 1.25 MG (50000 UNIT) CAPS capsule  Has the patient contacted their pharmacy? no (Agent: If no, request that the patient contact the pharmacy for the refill. If patient does not wish to contact the pharmacy document the reason why and proceed with request.) (Agent: If yes, when and what did the pharmacy advise?)   This is the patient's preferred pharmacy:  [id number] [address] [place], [place] - [address] [place] [address] Phone: [phone] Fax: [phone]  Is this the correct pharmacy for this prescription? Yes If no, delete pharmacy and type the correct one.   Has the prescription been filled recently? No  Is the patient out of the medication? Yes  Has the patient been seen for an appointment in the last year OR does the patient have an upcoming appointment? Yes  Can we respond through MyChart? Yes  Agent: Please be advised that Rx refills may take up to 3 business days. We ask that you follow-up with your pharmacy.

## 2024-01-10 NOTE — Telephone Encounter (Signed)
 Requested medication (s) are due for refill today: yes  Requested medication (s) are on the active medication list: yes  Last refill:  11/20/23  Future visit scheduled: no  Notes to clinic:   Manual Review: Route requests for 50,000 IU strength to the provider      Requested Prescriptions  Pending Prescriptions Disp Refills   Vitamin D , Ergocalciferol , (DRISDOL ) 1.25 MG (50000 UNIT) CAPS capsule 12 capsule 0    Sig: Take 1 capsule (50,000 Units total) by mouth every 7 (seven) days for 12 doses.     Endocrinology:  Vitamins - Vitamin D  Supplementation 2 Failed - 01/10/2024  1:57 PM      Failed - Manual Review: Route requests for 50,000 IU strength to the provider      Failed - Vitamin D  in normal range and within 360 days    Vit D, 25-Hydroxy  Date Value Ref Range Status  11/17/2023 18.2 (L) 30.0 - 100.0 ng/mL Final    Comment:    Vitamin D  deficiency has been defined by the Institute of Medicine and an Endocrine Society practice guideline as a level of serum 25-OH vitamin D  less than 20 ng/mL (1,2). The Endocrine Society went on to further define vitamin D  insufficiency as a level between 21 and 29 ng/mL (2). 1. IOM (Institute of Medicine). 2010. Dietary reference    intakes for calcium and D. [place]: The    [employer]. 2. Holick MF, Binkley Leland, Bischoff-Ferrari HA, et al.    Evaluation, treatment, and prevention of vitamin D     deficiency: an Endocrine Society clinical practice    guideline. JCEM. 2011 Jul; 96(7):1911-30.          Passed - Ca in normal range and within 360 days    Calcium  Date Value Ref Range Status  11/17/2023 10.2 8.7 - 10.2 mg/dL Final         Passed - Valid encounter within last 12 months    Recent Outpatient Visits           1 month ago Encounter to establish care   [facility], [place], NP   5 years ago Sarcoidosis   [facility] [place]. [facility] Brien Belvie BRAVO, MD

## 2024-01-23 DIAGNOSIS — E291 Testicular hypofunction: Secondary | ICD-10-CM

## 2024-02-19 ENCOUNTER — Other Ambulatory Visit: Payer: Self-pay

## 2024-02-27 ENCOUNTER — Other Ambulatory Visit: Payer: Self-pay
